# Patient Record
Sex: Female | Born: 1940 | Race: White | Hispanic: No | Marital: Married | Smoking: Never smoker
Health system: Southern US, Community
[De-identification: ages and names within clinical notes are randomized; demographics above are authoritative.]

## PROBLEM LIST (undated history)

## (undated) DIAGNOSIS — E785 Hyperlipidemia, unspecified: Secondary | ICD-10-CM

## (undated) DIAGNOSIS — D0510 Intraductal carcinoma in situ of unspecified breast: Secondary | ICD-10-CM

## (undated) DIAGNOSIS — Z780 Asymptomatic menopausal state: Secondary | ICD-10-CM

## (undated) DIAGNOSIS — K2970 Gastritis, unspecified, without bleeding: Secondary | ICD-10-CM

## (undated) DIAGNOSIS — C50919 Malignant neoplasm of unspecified site of unspecified female breast: Secondary | ICD-10-CM

## (undated) DIAGNOSIS — N814 Uterovaginal prolapse, unspecified: Secondary | ICD-10-CM

## (undated) DIAGNOSIS — K635 Polyp of colon: Secondary | ICD-10-CM

## (undated) DIAGNOSIS — K648 Other hemorrhoids: Secondary | ICD-10-CM

## (undated) DIAGNOSIS — C4491 Basal cell carcinoma of skin, unspecified: Secondary | ICD-10-CM

## (undated) DIAGNOSIS — N952 Postmenopausal atrophic vaginitis: Secondary | ICD-10-CM

## (undated) DIAGNOSIS — K269 Duodenal ulcer, unspecified as acute or chronic, without hemorrhage or perforation: Secondary | ICD-10-CM

## (undated) HISTORY — DX: Other hemorrhoids: K64.8

## (undated) HISTORY — DX: Postmenopausal atrophic vaginitis: N95.2

## (undated) HISTORY — DX: Asymptomatic menopausal state: Z78.0

## (undated) HISTORY — DX: Gastritis, unspecified, without bleeding: K29.70

## (undated) HISTORY — PX: OTHER SURGICAL HISTORY: SHX169

## (undated) HISTORY — DX: Intraductal carcinoma in situ of unspecified breast: D05.10

## (undated) HISTORY — DX: Hyperlipidemia, unspecified: E78.5

## (undated) HISTORY — DX: Duodenal ulcer, unspecified as acute or chronic, without hemorrhage or perforation: K26.9

## (undated) HISTORY — DX: Uterovaginal prolapse, unspecified: N81.4

## (undated) HISTORY — DX: Basal cell carcinoma of skin, unspecified: C44.91

## (undated) HISTORY — PX: DILATION AND CURETTAGE OF UTERUS: SHX78

## (undated) HISTORY — DX: Polyp of colon: K63.5

---

## 2004-07-28 ENCOUNTER — Ambulatory Visit: Payer: Self-pay | Admitting: Unknown Physician Specialty

## 2004-09-02 ENCOUNTER — Ambulatory Visit: Payer: Self-pay | Admitting: Obstetrics and Gynecology

## 2005-09-08 ENCOUNTER — Ambulatory Visit: Payer: Self-pay | Admitting: Obstetrics and Gynecology

## 2006-09-12 ENCOUNTER — Ambulatory Visit: Payer: Self-pay | Admitting: Obstetrics and Gynecology

## 2007-09-21 ENCOUNTER — Ambulatory Visit: Payer: Self-pay | Admitting: Obstetrics and Gynecology

## 2007-10-01 ENCOUNTER — Ambulatory Visit: Payer: Self-pay | Admitting: Obstetrics and Gynecology

## 2007-10-04 DIAGNOSIS — C50919 Malignant neoplasm of unspecified site of unspecified female breast: Secondary | ICD-10-CM

## 2007-10-04 HISTORY — PX: MASTECTOMY: SHX3

## 2007-10-04 HISTORY — DX: Malignant neoplasm of unspecified site of unspecified female breast: C50.919

## 2007-10-31 ENCOUNTER — Ambulatory Visit: Payer: Self-pay | Admitting: Surgery

## 2007-11-04 ENCOUNTER — Ambulatory Visit: Payer: Self-pay | Admitting: Internal Medicine

## 2007-11-07 ENCOUNTER — Ambulatory Visit: Payer: Self-pay | Admitting: Surgery

## 2007-11-26 ENCOUNTER — Ambulatory Visit: Payer: Self-pay | Admitting: Internal Medicine

## 2007-12-02 ENCOUNTER — Ambulatory Visit: Payer: Self-pay | Admitting: Internal Medicine

## 2007-12-24 ENCOUNTER — Ambulatory Visit: Payer: Self-pay | Admitting: Surgery

## 2008-01-02 ENCOUNTER — Ambulatory Visit: Payer: Self-pay | Admitting: Internal Medicine

## 2008-01-25 ENCOUNTER — Ambulatory Visit: Payer: Self-pay | Admitting: Internal Medicine

## 2008-04-02 ENCOUNTER — Ambulatory Visit: Payer: Self-pay | Admitting: Internal Medicine

## 2008-04-25 ENCOUNTER — Ambulatory Visit: Payer: Self-pay | Admitting: Internal Medicine

## 2008-05-03 ENCOUNTER — Ambulatory Visit: Payer: Self-pay | Admitting: Internal Medicine

## 2008-10-03 ENCOUNTER — Ambulatory Visit: Payer: Self-pay | Admitting: Internal Medicine

## 2008-10-10 ENCOUNTER — Ambulatory Visit: Payer: Self-pay | Admitting: Obstetrics and Gynecology

## 2008-10-24 ENCOUNTER — Ambulatory Visit: Payer: Self-pay | Admitting: Internal Medicine

## 2008-11-03 ENCOUNTER — Ambulatory Visit: Payer: Self-pay | Admitting: Internal Medicine

## 2009-04-02 ENCOUNTER — Ambulatory Visit: Payer: Self-pay | Admitting: Internal Medicine

## 2009-04-24 ENCOUNTER — Ambulatory Visit: Payer: Self-pay | Admitting: Internal Medicine

## 2009-05-03 ENCOUNTER — Ambulatory Visit: Payer: Self-pay | Admitting: Internal Medicine

## 2009-08-19 ENCOUNTER — Ambulatory Visit: Payer: Self-pay | Admitting: Unknown Physician Specialty

## 2009-10-12 ENCOUNTER — Ambulatory Visit: Payer: Self-pay | Admitting: Obstetrics and Gynecology

## 2009-12-01 ENCOUNTER — Ambulatory Visit: Payer: Self-pay | Admitting: Internal Medicine

## 2009-12-25 ENCOUNTER — Ambulatory Visit: Payer: Self-pay | Admitting: Internal Medicine

## 2010-01-01 ENCOUNTER — Ambulatory Visit: Payer: Self-pay | Admitting: Internal Medicine

## 2010-10-14 ENCOUNTER — Ambulatory Visit: Payer: Self-pay | Admitting: Obstetrics and Gynecology

## 2010-11-30 ENCOUNTER — Ambulatory Visit: Payer: Self-pay | Admitting: Specialist

## 2010-12-22 ENCOUNTER — Ambulatory Visit: Payer: Self-pay | Admitting: Unknown Physician Specialty

## 2010-12-22 DIAGNOSIS — K635 Polyp of colon: Secondary | ICD-10-CM | POA: Insufficient documentation

## 2010-12-22 DIAGNOSIS — K2970 Gastritis, unspecified, without bleeding: Secondary | ICD-10-CM | POA: Insufficient documentation

## 2010-12-24 ENCOUNTER — Ambulatory Visit: Payer: Self-pay | Admitting: Internal Medicine

## 2011-10-24 ENCOUNTER — Ambulatory Visit: Payer: Self-pay | Admitting: Obstetrics and Gynecology

## 2011-12-23 ENCOUNTER — Ambulatory Visit: Payer: Self-pay | Admitting: Internal Medicine

## 2012-01-02 ENCOUNTER — Ambulatory Visit: Payer: Self-pay | Admitting: Internal Medicine

## 2012-10-25 ENCOUNTER — Ambulatory Visit: Payer: Self-pay | Admitting: Obstetrics and Gynecology

## 2012-12-27 ENCOUNTER — Ambulatory Visit: Payer: Self-pay | Admitting: Internal Medicine

## 2013-10-28 ENCOUNTER — Ambulatory Visit: Payer: Self-pay | Admitting: Obstetrics and Gynecology

## 2014-09-16 LAB — HM PAP SMEAR: HM Pap smear: NEGATIVE

## 2014-11-12 ENCOUNTER — Ambulatory Visit: Payer: Self-pay | Admitting: Obstetrics and Gynecology

## 2014-11-12 DIAGNOSIS — Z1231 Encounter for screening mammogram for malignant neoplasm of breast: Secondary | ICD-10-CM | POA: Diagnosis not present

## 2014-11-12 DIAGNOSIS — Z853 Personal history of malignant neoplasm of breast: Secondary | ICD-10-CM | POA: Diagnosis not present

## 2014-11-12 DIAGNOSIS — Z9012 Acquired absence of left breast and nipple: Secondary | ICD-10-CM | POA: Diagnosis not present

## 2014-11-12 LAB — HM MAMMOGRAPHY: HM Mammogram: NEGATIVE

## 2015-07-19 DIAGNOSIS — Z23 Encounter for immunization: Secondary | ICD-10-CM | POA: Diagnosis not present

## 2015-09-22 ENCOUNTER — Ambulatory Visit (INDEPENDENT_AMBULATORY_CARE_PROVIDER_SITE_OTHER): Payer: Medicare Other | Admitting: Obstetrics and Gynecology

## 2015-09-22 ENCOUNTER — Other Ambulatory Visit: Payer: Self-pay | Admitting: Obstetrics and Gynecology

## 2015-09-22 ENCOUNTER — Encounter: Payer: Self-pay | Admitting: Obstetrics and Gynecology

## 2015-09-22 VITALS — BP 173/97 | HR 94 | Ht 62.0 in | Wt 134.7 lb

## 2015-09-22 DIAGNOSIS — Z01419 Encounter for gynecological examination (general) (routine) without abnormal findings: Secondary | ICD-10-CM

## 2015-09-22 DIAGNOSIS — R030 Elevated blood-pressure reading, without diagnosis of hypertension: Secondary | ICD-10-CM

## 2015-09-22 DIAGNOSIS — D0512 Intraductal carcinoma in situ of left breast: Secondary | ICD-10-CM

## 2015-09-22 DIAGNOSIS — Z780 Asymptomatic menopausal state: Secondary | ICD-10-CM

## 2015-09-22 DIAGNOSIS — K2981 Duodenitis with bleeding: Secondary | ICD-10-CM | POA: Diagnosis not present

## 2015-09-22 DIAGNOSIS — R42 Dizziness and giddiness: Secondary | ICD-10-CM | POA: Diagnosis not present

## 2015-09-22 DIAGNOSIS — N814 Uterovaginal prolapse, unspecified: Secondary | ICD-10-CM | POA: Diagnosis not present

## 2015-09-22 DIAGNOSIS — Z1239 Encounter for other screening for malignant neoplasm of breast: Secondary | ICD-10-CM

## 2015-09-22 DIAGNOSIS — R300 Dysuria: Secondary | ICD-10-CM | POA: Diagnosis not present

## 2015-09-22 DIAGNOSIS — IMO0001 Reserved for inherently not codable concepts without codable children: Secondary | ICD-10-CM

## 2015-09-22 DIAGNOSIS — E785 Hyperlipidemia, unspecified: Secondary | ICD-10-CM | POA: Diagnosis not present

## 2015-09-22 DIAGNOSIS — Z1211 Encounter for screening for malignant neoplasm of colon: Secondary | ICD-10-CM

## 2015-09-22 DIAGNOSIS — K269 Duodenal ulcer, unspecified as acute or chronic, without hemorrhage or perforation: Secondary | ICD-10-CM

## 2015-09-22 DIAGNOSIS — K921 Melena: Secondary | ICD-10-CM

## 2015-09-22 DIAGNOSIS — Z1231 Encounter for screening mammogram for malignant neoplasm of breast: Secondary | ICD-10-CM

## 2015-09-22 LAB — POCT URINALYSIS DIPSTICK
Bilirubin, UA: NEGATIVE
Glucose, UA: NEGATIVE
KETONES UA: NEGATIVE
LEUKOCYTES UA: NEGATIVE
Nitrite, UA: NEGATIVE
Protein, UA: NEGATIVE
Spec Grav, UA: 1.015
UROBILINOGEN UA: 0.2
pH, UA: 6

## 2015-09-22 MED ORDER — OMEPRAZOLE 40 MG PO CPDR: 40.0000 mg | DELAYED_RELEASE_CAPSULE | Freq: Every day | ORAL | Status: DC

## 2015-09-22 NOTE — Patient Instructions (Signed)
1.  No pap smear is done today. 2.  Your mammogram is due in February 2017. 3.  Stool guaiac cards are given to check for blood in the stool. 4.  Blood work is done today including CBC and CMP to assess hypertension and rule out anemia. 5.  Cardiology referral to Dr. Karl Bales for hypertension and dizziness. 6.  If there is anemia on your blood work or if there is blood in her stool, he will have to see Dr. Tiffany Kocher in gastroenterology to rule out ulcer disease. 7.  Take your blood pressure at home once a day for the next 2 weeks before seeing cardiology. 8.  Return in 1 year or as needed

## 2015-09-22 NOTE — Progress Notes (Signed)
Patient ID: Terri Clark, female   DOB: Jul 26, 1941, 74 y.o.   MRN: [id number] ANNUAL PREVENTATIVE CARE GYN  ENCOUNTER NOTE  Subjective:       Terri Clark is a 74 y.o. G57P2002 female here for a routine annual gynecologic exam.  Current complaints: 1.  Burning with urination  2. Dizziness and feeling tired 3. Black tarry stools  Terri Clark presents today for her annual physical. Significant concerns today include dysuria. She also is experiencing intermittent dizziness, lightheadedness, just not feeling well. Energy level is not what it used to be. She does walk on the treadmill 40 minutes a day 5 days a week. She does not experience dyspareunia, orthopnea, lower extremity swelling, chest discomfort, palpitations. She has not had a cardiology workup. Patient also has history of peptic ulcer disease. Over the past several months she has been developing black tarry stools without bright red blood in the bowel movements. She did modify her diet to decrease spicy food intake, and has continued with omeprazole and antacids. Her acid indigestion has diminished as has her stools. She did have a colonoscopy last year which was normal. She did not have an EGD.   Gynecologic History No LMP recorded. Patient is postmenopausal. Contraception: post menopausal status Last Pap: 09/16/2014 neg results were: normal/ Last mammogram: 11/12/2014 birad 1. Results were: {norm/abn:[id number]  Obstetric History OB History  Gravida Para Term Preterm AB SAB TAB Ectopic Multiple Living  2 2 2       2     # Outcome Date GA Lbr Len/2nd Weight Sex Delivery Anes PTL Lv  2 Term      Vag-Spont   Y  1 Term      Vag-Spont   Y      Past Medical History  Diagnosis Date  . Gastritis   . Duodenal ulcer   . Colon polyps   . Hyperlipemia   . Basal cell carcinoma   . Internal hemorrhoid   . Uterine prolapse   . Menopause   . Vaginal atrophy   . 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 Mastectomy      BILATERAL SIMPLE WITH SENTINTEL NODE BIOPSY  . Dilation and curettage of uterus      Current Outpatient Prescriptions on File Prior to Visit  Medication Sig Dispense Refill  . aspirin 81 MG tablet Take 81 mg by mouth daily.    Marland Kitchen b complex vitamins tablet Take 1 tablet by mouth daily.    . calcium-vitamin D (OSCAL WITH D) 500-200 MG-UNIT tablet Take 1 tablet by mouth.    . Chromium Picolinate 500 MCG CAPS Take by mouth.    . loratadine (CLARITIN) 10 MG tablet Take 10 mg by mouth daily.    . Melatonin 10 MG CAPS Take by mouth.    . Multiple Vitamins-Minerals (MULTIVITAMIN ADULT PO) Take by mouth.    . omega-3 acid ethyl esters (LOVAZA) 1 G capsule Take by mouth 2 (two) times daily.    Marland Kitchen omeprazole (PRILOSEC) 40 MG capsule Take 40 mg by mouth daily.     No current facility-administered medications on file prior to visit.    Allergies  Allergen Reactions  . Amoxicillin   . Biaxin [Clarithromycin]   . Theracodophen-325 [Hydrocodone-Apap-Dietary Prod]     Social History   Social History  . Marital Status: Married    Spouse  Name: N/A  . Number of Children: N/A  . Years of Education: N/A   Occupational History  . Not on file.   Social History Main Topics  . Smoking status: Never Smoker   . Smokeless tobacco: Not on file  . Alcohol Use: No  . Drug Use: No  . Sexual Activity: Not Currently    Birth Control/ Protection: Post-menopausal   Other Topics Concern  . Not on file   Social History Narrative    Family History  Problem Relation Age of Onset  . Colon cancer Sister   . Breast cancer Neg Hx   . Ovarian cancer Neg Hx   . Diabetes Neg Hx   . Heart disease Neg Hx     The following portions of the patient's history were reviewed and updated as appropriate: allergies, current medications, past family history, past medical history, past social history, past surgical  history and problem list.  Review of Systems ROS Review of Systems - General ROS: negative for - chills, fatigue, fever, hot flashes, night sweats, weight gain or weight loss Psychological ROS: negative for - anxiety, decreased libido, depression, mood swings, physical abuse or sexual abuse Ophthalmic ROS: negative for - blurry vision, eye pain or loss of vision ENT ROS: negative for - headaches, hearing change, visual changes or vocal changes Allergy and Immunology ROS: negative for - hives, itchy/watery eyes or seasonal allergies Hematological and Lymphatic ROS: negative for - bleeding problems, bruising, swollen lymph nodes or weight loss Endocrine ROS: negative for - galactorrhea, hair pattern changes, hot flashes, malaise/lethargy, mood swings, palpitations, polydipsia/polyuria, skin changes, temperature intolerance or unexpected weight changes Breast ROS: negative for - new or changing breast lumps or nipple discharge Respiratory ROS: negative for - cough or shortness of breath Cardiovascular ROS: negative for - chest pain, irregular heartbeat, palpitations or shortness of breath Gastrointestinal ROS: no abdominal pain, change in bowel habits, or black or bloody stools Genito-Urinary ROS: no dysuria, trouble voiding, or hematuria Musculoskeletal ROS: negative for - joint pain or joint stiffness Neurological ROS: negative for - bowel and bladder control changes Dermatological ROS: negative for rash and skin lesion changes   Objective:   BP 173/97 mmHg  Pulse 94  Ht 5\' 2"  (1.575 m)  Wt 134 lb 11.2 oz (61.1 kg)  BMI 24.63 kg/m2 CONSTITUTIONAL: Well-developed, well-nourished female in no acute distress.  PSYCHIATRIC: Normal mood and affect. Normal behavior. Normal judgment and thought content. [place]: Alert and oriented to person, place, and time. Normal muscle tone coordination. No cranial nerve deficit noted. HENT:  Normocephalic, atraumatic, External right and left ear normal.  Oropharynx is clear and moist EYES: Conjunctivae and EOM are normal. Pupils are equal, round, and reactive to light. No scleral icterus.  NECK: Normal range of motion, supple, no masses.  Normal thyroid.  SKIN: Skin is warm and dry. No rash noted. Not diaphoretic. No erythema. No pallor. CARDIOVASCULAR: Normal heart rate noted, regular rhythm, no murmur. no carotid bruits. RESPIRATORY: Clear to auscultation bilaterally. Effort and breath sounds normal, no problems with respiration noted. BREASTS: No masses, skin changes, nipple drainage, or lymphadenopathy. Post mastectomy changes, nontender, left breast ABDOMEN: Soft, normal bowel sounds, no distention noted.  No tenderness, rebound or guarding.  BLADDER: Normal PELVIC:  External Genitalia: Normal  BUS: Normal  Vagina:  Atrophic changes present  Cervix: Normal; second-degree prolapse with cervix coming within 3 cm of the introitus   Uterus: Normal size and shape, prolapsed to mid vagina (second-degree)   Adnexa:  Normal  RV: External Exam NormaI, No Rectal Masses and Normal Sphincter tone  MUSCULOSKELETAL: Normal range of motion. No tenderness.  No cyanosis, clubbing, or edema.  2+ distal pulses. LYMPHATIC: No Axillary, Supraclavicular, or Inguinal Adenopathy.    Assessment:   Annual gynecologic examination 74 y.o. Contraception: post menopausal status bmi- 24 Elevated blood pressure Dizziness, fatigue, possibly cardiac related History of gastritis and duodenal ulcer; history of melena in the past few months with resolution following changes and continuation with reflux medications. Normal colonoscopy in the past year without recent EGD   Plan:  Pap: Not needed Mammogram: Ordered Stool Guaiac Testing:  Ordered Labs: tsh, vit d, lipid, a1c, fbs Routine preventative health maintenance measures emphasized: Exercise/Diet/Weight control, Tobacco Warnings, Alcohol/Substance use risks and Stress Management  cardiology consult for  hypertension and dizziness/fatigue symptomatology Check CBC to rule out anemia. If anemia is present and/or if so guaiac card testing is positive, recommend follow-up with Dr. Vira Agar in GI for assessment of ulcer disease. Return to [place], [place]  Brayton Mars, MD  Note: This dictation was prepared with Dragon dictation along with smaller phrase technology. Any transcriptional errors that result from this process are unintentional.

## 2015-09-23 LAB — URINE CULTURE: Organism ID, Bacteria: NO GROWTH

## 2015-09-24 DIAGNOSIS — R030 Elevated blood-pressure reading, without diagnosis of hypertension: Secondary | ICD-10-CM

## 2015-09-24 DIAGNOSIS — N814 Uterovaginal prolapse, unspecified: Secondary | ICD-10-CM | POA: Insufficient documentation

## 2015-09-24 DIAGNOSIS — K269 Duodenal ulcer, unspecified as acute or chronic, without hemorrhage or perforation: Secondary | ICD-10-CM | POA: Insufficient documentation

## 2015-09-24 DIAGNOSIS — K2981 Duodenitis with bleeding: Secondary | ICD-10-CM | POA: Insufficient documentation

## 2015-09-24 DIAGNOSIS — D0512 Intraductal carcinoma in situ of left breast: Secondary | ICD-10-CM | POA: Insufficient documentation

## 2015-09-24 DIAGNOSIS — Z780 Asymptomatic menopausal state: Secondary | ICD-10-CM | POA: Insufficient documentation

## 2015-09-24 DIAGNOSIS — IMO0001 Reserved for inherently not codable concepts without codable children: Secondary | ICD-10-CM | POA: Insufficient documentation

## 2015-09-24 DIAGNOSIS — Z800 Family history of malignant neoplasm of digestive organs: Secondary | ICD-10-CM | POA: Insufficient documentation

## 2015-09-24 DIAGNOSIS — E7800 Pure hypercholesterolemia, unspecified: Secondary | ICD-10-CM | POA: Insufficient documentation

## 2015-09-24 DIAGNOSIS — C4441 Basal cell carcinoma of skin of scalp and neck: Secondary | ICD-10-CM | POA: Insufficient documentation

## 2015-09-24 LAB — FECAL OCCULT BLOOD, IMMUNOCHEMICAL: Fecal Occult Bld: NEGATIVE

## 2015-09-29 ENCOUNTER — Other Ambulatory Visit: Payer: Medicare Other

## 2015-09-29 DIAGNOSIS — Z131 Encounter for screening for diabetes mellitus: Secondary | ICD-10-CM | POA: Diagnosis not present

## 2015-09-29 DIAGNOSIS — E559 Vitamin D deficiency, unspecified: Secondary | ICD-10-CM | POA: Diagnosis not present

## 2015-09-29 DIAGNOSIS — Z01419 Encounter for gynecological examination (general) (routine) without abnormal findings: Secondary | ICD-10-CM | POA: Diagnosis not present

## 2015-09-29 DIAGNOSIS — R42 Dizziness and giddiness: Secondary | ICD-10-CM | POA: Diagnosis not present

## 2015-09-29 DIAGNOSIS — R030 Elevated blood-pressure reading, without diagnosis of hypertension: Secondary | ICD-10-CM | POA: Diagnosis not present

## 2015-09-29 DIAGNOSIS — K921 Melena: Secondary | ICD-10-CM | POA: Diagnosis not present

## 2015-09-29 DIAGNOSIS — E785 Hyperlipidemia, unspecified: Secondary | ICD-10-CM | POA: Diagnosis not present

## 2015-09-30 LAB — VITAMIN D 25 HYDROXY (VIT D DEFICIENCY, FRACTURES): VIT D 25 HYDROXY: 58.1 ng/mL (ref 30.0–100.0)

## 2015-09-30 LAB — COMPREHENSIVE METABOLIC PANEL
A/G RATIO: 1.6 (ref 1.1–2.5)
ALK PHOS: 72 IU/L (ref 39–117)
ALT: 16 IU/L (ref 0–32)
AST: 21 IU/L (ref 0–40)
Albumin: 4.2 g/dL (ref 3.5–4.8)
BILIRUBIN TOTAL: 0.3 mg/dL (ref 0.0–1.2)
BUN/Creatinine Ratio: 21 (ref 11–26)
BUN: 15 mg/dL (ref 8–27)
CHLORIDE: 100 mmol/L (ref 96–106)
CO2: 26 mmol/L (ref 18–29)
Creatinine, Ser: 0.71 mg/dL (ref 0.57–1.00)
GFR calc Af Amer: 96 mL/min/{1.73_m2} (ref >59)
GFR, EST NON AFRICAN AMERICAN: 84 mL/min/{1.73_m2} (ref >59)
GLOBULIN, TOTAL: 2.6 g/dL (ref 1.5–4.5)
GLUCOSE: 87 mg/dL (ref 65–99)
POTASSIUM: 4.6 mmol/L (ref 3.5–5.2)
SODIUM: 141 mmol/L (ref 134–144)

## 2015-09-30 LAB — HEMOGLOBIN A1C
ESTIMATED AVERAGE GLUCOSE: 120 mg/dL
HEMOGLOBIN A1C: 5.8 % — AB (ref 4.8–5.6)

## 2015-09-30 LAB — CBC WITH DIFFERENTIAL/PLATELET
BASOS ABS: 0.1 10*3/uL (ref 0.0–0.2)
BASOS: 1 %
EOS (ABSOLUTE): 0.3 10*3/uL (ref 0.0–0.4)
Eos: 4 %
Hematocrit: 39.9 % (ref 34.0–46.6)
Hemoglobin: 13.4 g/dL (ref 11.1–15.9)
IMMATURE GRANULOCYTES: 0 %
Immature Grans (Abs): 0 10*3/uL (ref 0.0–0.1)
Lymphocytes Absolute: 2.2 10*3/uL (ref 0.7–3.1)
Lymphs: 32 %
MCH: 30.6 pg (ref 26.6–33.0)
MCHC: 33.6 g/dL (ref 31.5–35.7)
MCV: 91 fL (ref 79–97)
MONOS ABS: 0.7 10*3/uL (ref 0.1–0.9)
Monocytes: 10 %
NEUTROS ABS: 3.8 10*3/uL (ref 1.4–7.0)
NEUTROS PCT: 53 %
PLATELETS: 431 10*3/uL — AB (ref 150–379)
RBC: 4.38 x10E6/uL (ref 3.77–5.28)
RDW: 13.6 % (ref 12.3–15.4)
WBC: 7 10*3/uL (ref 3.4–10.8)

## 2015-09-30 LAB — LIPID PANEL
CHOLESTEROL TOTAL: 244 mg/dL — AB (ref 100–199)
Chol/HDL Ratio: 2.8 ratio units (ref 0.0–4.4)
HDL: 87 mg/dL (ref >39)
LDL Calculated: 139 mg/dL — ABNORMAL HIGH (ref 0–99)
Triglycerides: 88 mg/dL (ref 0–149)
VLDL Cholesterol Cal: 18 mg/dL (ref 5–40)

## 2015-09-30 LAB — TSH: TSH: 2.26 u[IU]/mL (ref 0.450–4.500)

## 2015-11-12 ENCOUNTER — Encounter (INDEPENDENT_AMBULATORY_CARE_PROVIDER_SITE_OTHER): Payer: Self-pay

## 2015-11-12 ENCOUNTER — Ambulatory Visit (INDEPENDENT_AMBULATORY_CARE_PROVIDER_SITE_OTHER): Payer: Medicare Other | Admitting: Cardiovascular Disease

## 2015-11-12 ENCOUNTER — Encounter: Payer: Self-pay | Admitting: Cardiovascular Disease

## 2015-11-12 VITALS — BP 140/70 | HR 89 | Ht 62.0 in | Wt 131.0 lb

## 2015-11-12 DIAGNOSIS — R079 Chest pain, unspecified: Secondary | ICD-10-CM

## 2015-11-12 DIAGNOSIS — R42 Dizziness and giddiness: Secondary | ICD-10-CM | POA: Diagnosis not present

## 2015-11-12 DIAGNOSIS — I159 Secondary hypertension, unspecified: Secondary | ICD-10-CM | POA: Diagnosis not present

## 2015-11-12 DIAGNOSIS — R030 Elevated blood-pressure reading, without diagnosis of hypertension: Secondary | ICD-10-CM

## 2015-11-12 DIAGNOSIS — R0602 Shortness of breath: Secondary | ICD-10-CM

## 2015-11-12 DIAGNOSIS — IMO0001 Reserved for inherently not codable concepts without codable children: Secondary | ICD-10-CM

## 2015-11-12 DIAGNOSIS — E785 Hyperlipidemia, unspecified: Secondary | ICD-10-CM

## 2015-11-12 NOTE — Assessment & Plan Note (Signed)
She has provided blood pressure readings today, some of which are very low I suspect when she stands up at nighttime to get out of bed she is having orthostasis. Suggested she sit for several minutes before standing to go the bathroom Also suggested she increase her fluid intake during the daytime, but cutback on her fluids into the evening If symptoms get worse, she could wear thigh-high compression hose Suggested she liberalize her salt intake, she had been following a very strict salt regiment

## 2015-11-12 NOTE — Assessment & Plan Note (Signed)
Suspect she had elevated blood pressure on her office visit with Dr. Enzo Bi though pages of blood pressures since that time have shown excellent if not low blood pressures, possibly contributing to periodic episodes of orthostasis. No medications needed at this time, suggested she liberalize her salt intake as she has been very strict since that appointment

## 2015-11-12 NOTE — Assessment & Plan Note (Signed)
She prefers not to take a cholesterol medication As triglycerides are low, suggested she could decrease her fish oil intake, Perhaps increase oatmeal and try over-the-counter red yeast rice In addition to her dietary changes, this should help her numbers  We did discuss CT coronary calcium scoring for risk stratification. She will research this and call us if she would like this test

## 2015-11-12 NOTE — Progress Notes (Signed)
Patient ID: Terri Clark, female    DOB: 04/02/1941, 75 y.o.   MRN: [id number]  HPI Comments: [name] is a pleasant 75 year old woman with remote history of smoking for 7 years when she was a teenager, who presents for consultation by referral from Dr. Enzo Bi for hypertension and dizziness/lightheadedness.  On her annual visit with Dr. Enzo Bi she was noted to have systolic pressure in the [id number] range She reports that she was anxious and has been monitoring her blood pressure daily since that time She provides these measurements with her today, on average systolic pressure ranging from 104 up to the 130 range, rarely more than 140. Heart rate typically 60s to 70s, one episode of heart rate 115 bpm in the setting of an allergy attack, coughing  She does report having episodes of lightheadedness, dizziness at nighttime when she gets out of bed to go to the bathroom This does not happen all time, rarely Typically will happen if she stands up too quickly Sometimes dizzy even coming back from the bathroom  She does try to drink fluids in the daytime, Reports that she has changed her diet significantly since she was told her cholesterol was elevated  Recent lab work showing total cholesterol around 240 She has been taking high-dose fish oil because she thought it was good for her She prefers not to be in a cholesterol medication  She does exercise on a regular basis, 40 minutes aerobic 5 days per week  Lab work reviewed with her showing hemoglobin A1c 5.8  EKG on today's visit shows normal sinus rhythm with rate 89 bpm, no significant ST or T-wave changes   Lab Results      Component                Value               Date                      CHOL                     244*                09/29/2015                HDL                      87                  09/29/2015                LDLCALC                  139*                09/29/2015                TRIG                      88                  09/29/2015             Review of Systems  Constitutional: Negative.   Respiratory: Negative.   Cardiovascular: Negative.   Gastrointestinal: Negative.   Musculoskeletal: Negative.   Neurological: Positive for light-headedness.  Hematological: Negative.   Psychiatric/Behavioral: Negative.   All other systems reviewed and are  negative.   BP 140/70 mmHg  Pulse 89  Ht 5\' 2"  (1.575 m)  Wt 131 lb (59.421 kg)  BMI 23.95 kg/m2  Physical Exam  Constitutional: She is oriented to person, place, and time. She appears well-developed and well-nourished.  HENT:  Head: Normocephalic.  Nose: Nose normal.  Mouth/Throat: Oropharynx is clear and moist.  Eyes: Conjunctivae are normal. Pupils are equal, round, and reactive to light.  Neck: Normal range of motion. Neck supple. No JVD present.  Cardiovascular: Normal rate, regular rhythm, normal heart sounds and intact distal pulses.  Exam reveals no gallop and no friction rub.   No murmur heard. Pulmonary/Chest: Effort normal and breath sounds normal. No respiratory distress. She has no wheezes. She has no rales. She exhibits no tenderness.  Abdominal: Soft. Bowel sounds are normal. She exhibits no distension. There is no tenderness.  Musculoskeletal: Normal range of motion. She exhibits no edema or tenderness.  Lymphadenopathy:    She has no cervical adenopathy.  Neurological: She is alert and oriented to person, place, and time. Coordination normal.  Skin: Skin is warm and dry. No rash noted. No erythema.  Psychiatric: She has a normal mood and affect. Her behavior is normal. Judgment and thought content normal.

## 2015-11-12 NOTE — Patient Instructions (Addendum)
You are doing well. No medication changes were made.  Try the RED YEAST RICE for cholesterol  Lightheaded spells is likely from low blood pressure Drink more fluids  Research CT coronary calcium score for risk assessment  Please call us if you have new issues that need to be addressed before your next appt.    Red Yeast Rice capsules What is this medicine? RED YEAST RICE (red yeest rahys) is intended to be used by healthy adults to help lower blood cholesterol in conjunction with a healthy diet and a regular exercise program. The FDA has not approved this supplement for any medical use. If medical treatment is needed for cholesterol control or any other disease, you should contact your doctor or health care professional regarding the use of this product. This supplement may be used for other purposes; ask your health care provider or pharmacist if you have questions. This medicine may be used for other purposes; ask your health care provider or pharmacist if you have questions. What should I tell my health care provider before I take this medicine? They need to know if you have any of these conditions: -frequently drink alcoholic beverages -kidney disease -liver disease -muscle aches or weakness -other medical condition -an unusual or allergic reaction to red yeast rice, went yeast, lovastatin, other 'statin' medications, other medicines, foods, dyes, or preservatives -pregnant or trying to get pregnant -breast-feeding How should I use this medicine? Take this supplement by mouth with a glass of water. Follow the directions on the package labeling, or take as directed by your health care professional. Do not take this supplement more often than directed. Contact your pediatrician or health care professional regarding the use of this supplement in children. Special care may be needed. This supplement is not recommended for use in children. Overdosage: If you think you have taken too  much of this medicine contact a poison control center or emergency room at once. NOTE: This medicine is only for you. Do not share this medicine with others. What if I miss a dose? If you miss a dose, take it as soon as you can. If it is almost time for your next dose, take only that dose. Do not take double or extra doses. What may interact with this medicine? Do not take this medicine with any of the following medications: -clarithromycin -delavirdine -erythromycin -grapefruit juice -protease inhibitors used to treat HIV infection -medicines for fungal infections like itraconazole, ketoconazole, posaconazole, and voriconazole -mibefradil -nefazodone -other medicines for high cholesterol -telithromycin -troleandomycin This medicine may also interact with the following medications: -alcohol -amiodarone -colchicine -cyclosporine -danazol -diltiazem -fenofibrate -fluconazole -gemfibrozil -mifepristone, RU-486 -niacin -St. John's wort -verapamil -voriconazole -warfarin This list may not describe all possible interactions. Give your health care provider a list of all the medicines, herbs, non-prescription drugs, or dietary supplements you use. Also tell them if you smoke, drink alcohol, or use illegal drugs. Some items may interact with your medicine. What should I watch for while using this medicine? Visit your doctor or health care professional for regular check-ups. You may need regular tests to make sure your liver is working properly. Tell you doctor or health care professional right away if you get any unexplained muscle pain, tenderness, or weakness, especially if you also have a fever and tiredness. Some drugs may increase the risk of side effects from this supplement. If you are given certain antibiotics or antifungals, you should stop taking this supplement during those treatments. Check with your doctor or  pharmacist for advice. If you are scheduled for any medical or  dental procedure, tell your healthcare provider that you are taking this supplement. You may need to stop taking this supplement before the procedure. Do not use this drug if you are pregnant or breast-feeding. Serious side effects to an unborn child or to an infant are possible. Talk to your doctor or pharmacist for more information. Herbal or dietary supplements are not regulated like medicines. Rigid quality control standards are not required for dietary supplements. The purity and strength of these products can vary. The safety and effect of this dietary supplement for a certain disease or illness is not well known. This product is not intended to diagnose, treat, cure or prevent any disease. The Food and Drug Administration suggests the following to help consumers protect themselves: -Always read product labels and follow directions. -Natural does not mean a product is safe for humans to take. -Look for products that include USP after the ingredient name. This means that the manufacturer followed the standards of the [place] Pharmacopoeia. -Supplements made or sold by a nationally known food or drug company are more likely to be made under tight controls. You can write to the company for more information about how the product was made. What side effects may I notice from receiving this medicine? Side effects that you should report to your doctor or health care professional as soon as possible: -allergic reactions like skin rash, itching or hives, swelling of the face, lips, or tongue -dark urine -fever -joint pain -muscle cramps, pain -redness, blistering, peeling or loosening of the skin, including inside the mouth -trouble passing urine or change in the amount of urine -unusually weak or tired -yellowing of the eyes or skin Side effects that usually do not require medical attention (report to your doctor or health care professional if they continue or are  bothersome): -constipation -headache -stomach gas, pain, upset -nausea -trouble sleeping This list may not describe all possible side effects. Call your doctor for medical advice about side effects. You may report side effects to FDA at 1-800-FDA-1088. Where should I keep my medicine? Keep out of the reach of children. Store at room temperature between 15 and 30 degrees C (59 and 86 degrees F). Throw away any unused medicine after the expiration date. NOTE: This sheet is a summary. It may not cover all possible information. If you have questions about this medicine, talk to your doctor, pharmacist, or health care provider.    2016, Elsevier/Gold Standard. (2014-05-27 10:26:14) Hypotension As your heart beats, it forces blood through your arteries. This force is your blood pressure. If your blood pressure is too low for you to go about your normal activities or to support the organs of your body, you have hypotension. Hypotension is also referred to as low blood pressure. When your blood pressure becomes too low, you may not get enough blood to your brain. As a result, you may feel weak, feel lightheaded, or develop a rapid heart rate. In a more severe [place], you may faint. CAUSES Various conditions can cause hypotension. These include:  Blood loss.  Dehydration.  Heart or endocrine problems.  Pregnancy.  Severe infection.  Not having a well-balanced diet filled with needed nutrients.  Severe allergic reactions (anaphylaxis). Some medicines, such as blood pressure medicine or water pills (diuretics), may lower your blood pressure below normal. Sometimes taking too much medicine or taking medicine not as directed can cause hypotension. TREATMENT  Hospitalization is sometimes required  for hypotension if fluid or blood replacement is needed, if time is needed for medicines to wear off, or if further monitoring is needed. Treatment might include changing your diet, changing your medicines  (including medicines aimed at raising your blood pressure), and use of support stockings. HOME CARE INSTRUCTIONS   Drink enough fluids to keep your urine clear or pale yellow.  Take your medicines as directed by your health care provider.  Get up slowly from reclining or sitting positions. This gives your blood pressure a chance to adjust.  Wear support stockings as directed by your health care provider.  Maintain a healthy diet by including nutritious food, such as fruits, vegetables, nuts, whole grains, and lean meats. SEEK MEDICAL CARE IF:  You have vomiting or diarrhea.  You have a fever for more than 2-3 days.  You feel more thirsty than usual.  You feel weak and tired. SEEK IMMEDIATE MEDICAL CARE IF:   You have chest pain or a fast or irregular heartbeat.  You have a loss of feeling in some part of your body, or you lose movement in your arms or legs.  You have trouble speaking.  You become sweaty or feel lightheaded.  You faint. MAKE SURE YOU:   Understand these instructions.  Will watch your condition.  Will get help right away if you are not doing well or get worse.   This information is not intended to replace advice given to you by your health care provider. Make sure you discuss any questions you have with your health care provider.   Document Released: 09/19/2005 Document Revised: 07/10/2013 Document Reviewed: 03/22/2013 Elsevier Interactive Patient Education 2016 Elsevier Inc. Coronary Calcium Scan A coronary calcium scan is an imaging test used to look for deposits of calcium and other fatty materials (plaques) in the inner lining of the blood vessels of your heart (coronary arteries). These deposits of calcium and plaques can partly clog and narrow the coronary arteries without producing any symptoms or warning signs. This puts you at risk for a heart attack. This test can detect these deposits before symptoms develop.  LET [facility] CARE PROVIDER KNOW  ABOUT:  Any allergies you have.  All medicines you are taking, including vitamins, herbs, eye drops, creams, and over-the-counter medicines.  Previous problems you or members of your family have had with the use of anesthetics.  Any blood disorders you have.  Previous surgeries you have had.  Medical conditions you have.  Possibility of pregnancy, if this applies. RISKS AND COMPLICATIONS Generally, this is a safe procedure. However, as with any procedure, complications can occur. This test involves the use of radiation. Radiation exposure can be dangerous to a pregnant woman and her unborn baby. If you are pregnant, you should not have this procedure done.  BEFORE THE PROCEDURE There is no special preparation for the procedure. PROCEDURE  You will need to undress and put on a hospital gown. You will need to remove any jewelry around your neck or chest.  Sticky electrodes are placed on your chest and are connected to an electrocardiogram (EKG or electrocardiography) machine to recorda tracing of the electrical activity of your heart.  A CT scanner will take pictures of your heart. During this time, you will be asked to lie still and hold your breath for 2-3 seconds while a picture is being taken of your heart. AFTER THE PROCEDURE   You will be allowed to get dressed.  You can return to your normal activities after  the scan is done.   This information is not intended to replace advice given to you by your health care provider. Make sure you discuss any questions you have with your health care provider.   Document Released: 03/17/2008 Document Revised: 09/24/2013 Document Reviewed: 05/27/2013 Elsevier Interactive Patient Education [employer].

## 2015-11-17 ENCOUNTER — Ambulatory Visit
Admission: RE | Admit: 2015-11-17 | Discharge: 2015-11-17 | Disposition: A | Discharge status: Home / Self Care | Payer: Medicare Other | Source: Ambulatory Visit | Attending: Obstetrics and Gynecology | Admitting: Obstetrics and Gynecology

## 2015-11-17 ENCOUNTER — Other Ambulatory Visit: Payer: Self-pay | Admitting: Obstetrics and Gynecology

## 2015-11-17 DIAGNOSIS — Z1231 Encounter for screening mammogram for malignant neoplasm of breast: Secondary | ICD-10-CM | POA: Diagnosis not present

## 2015-11-17 HISTORY — DX: Malignant neoplasm of unspecified site of unspecified female breast: C50.919

## 2016-05-15 DIAGNOSIS — Z23 Encounter for immunization: Secondary | ICD-10-CM | POA: Diagnosis not present

## 2016-10-10 NOTE — Progress Notes (Signed)
Patient ID: Terri Clark, female   DOB: [birth year], 76 y.o.   MRN: [id number] ANNUAL PREVENTATIVE CARE GYN  ENCOUNTER NOTE  Subjective:       Terri Clark is a 76 y.o. G14P2002 female here for a routine annual gynecologic exam.  Current complaints: 1.refill omeprazole  Patient is feeling much better this year. She does walk on the treadmill 4 times a week for about 35-45 minutes. Bowel function is normal. Bladder function is normal. She is taking her calcium with vitamin D. No major interval health issues are identified. Blood pressure readings at home range anywhere from 119-140/58-69. 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 Son recently had 95% blockage in coronary, requiring 3 stents   Terri Clark presents today for her medicare breast and pelvic.   Gynecologic History No LMP recorded. Patient is postmenopausal. Contraception: post menopausal status Last Pap: 09/16/2014 neg results were: normal/ Last mammogram: 11/13/2015 birad 1. Results were: wnl  Obstetric History OB History  Gravida Para Term Preterm AB Living  2 2 2     2   SAB TAB Ectopic Multiple Live Births          2    # Outcome Date GA Lbr Len/2nd Weight Sex Delivery Anes PTL Lv  2 Term      Vag-Spont   LIV  1 Term      Vag-Spont   LIV      Past Medical History:  Diagnosis Date  . Basal cell carcinoma   . Breast cancer ([place]) 2009   left  . Colon polyps   . 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 Duodenal ulcer   . Gastritis   . Hyperlipemia   . Internal hemorrhoid   . Menopause   . Uterine prolapse   . Vaginal atrophy     Past Surgical History:  Procedure Laterality Date  . DILATION AND CURETTAGE OF UTERUS    . MASTECTOMY     BILATERAL SIMPLE WITH SENTINTEL NODE BIOPSY  . MASTECTOMY Left 2009    Current Outpatient Prescriptions on File Prior to Visit  Medication Sig Dispense Refill  . aspirin 81 MG tablet  Take 81 mg by mouth daily.    Marland Kitchen b complex vitamins tablet Take 1 tablet by mouth daily.    . calcium-vitamin D (OSCAL WITH D) 500-200 MG-UNIT tablet Take 1 tablet by mouth.    . Chromium Picolinate 500 MCG CAPS Take by mouth.    . loratadine (CLARITIN) 10 MG tablet Take 10 mg by mouth daily.    . Melatonin 10 MG CAPS Take by mouth.    . Multiple Vitamins-Minerals (MULTIVITAMIN ADULT PO) Take by mouth.    . omega-3 acid ethyl esters (LOVAZA) 1 G capsule Take by mouth 2 (two) times daily.    Marland Kitchen omeprazole (PRILOSEC) 40 MG capsule Take 1 capsule (40 mg total) by mouth daily. 90 capsule 3   No current facility-administered medications on file prior to visit.     Allergies  Allergen Reactions  . Amoxicillin   . Biaxin [Clarithromycin]   . Theracodophen-325 [Hydrocodone-Apap-Dietary Prod]     Social History   Social History  . Marital status: Married    Spouse name: N/A  . Number of children: N/A  . Years of education: N/A   Occupational History  . Not on file.   Social History Main Topics  . Smoking status: Never Smoker  .  Smokeless tobacco: Not on file  . Alcohol use No  . Drug use: No  . Sexual activity: Not Currently    Birth control/ protection: Post-menopausal   Other Topics Concern  . Not on file   Social History Narrative  . No narrative on file    Family History  Problem Relation Age of Onset  . Colon cancer Sister   . Breast cancer Neg Hx   . Ovarian cancer Neg Hx   . Diabetes Neg Hx   . Heart disease Neg Hx     The following portions of the patient's history were reviewed and updated as appropriate: allergies, current medications, past family history, past medical history, past social history, past surgical history and problem list.  Review of Systems ROS Review of Systems - General ROS: negative for - chills, fatigue, fever, hot flashes, night sweats, weight gain or weight loss Psychological ROS: negative for - anxiety, decreased libido, depression,  mood swings, physical abuse or sexual abuse Ophthalmic ROS: negative for - blurry vision, eye pain or loss of vision ENT ROS: negative for - headaches, hearing change, visual changes or vocal changes Allergy and Immunology ROS: negative for - hives, itchy/watery eyes or seasonal allergies Hematological and Lymphatic ROS: negative for - bleeding problems, bruising, swollen lymph nodes or weight loss Endocrine ROS: negative for - galactorrhea, hair pattern changes, hot flashes, malaise/lethargy, mood swings, palpitations, polydipsia/polyuria, skin changes, temperature intolerance or unexpected weight changes Breast ROS: negative for - new or changing breast lumps or nipple discharge Respiratory ROS: negative for - cough or shortness of breath Cardiovascular ROS: negative for - chest pain, irregular heartbeat, palpitations or shortness of breath Gastrointestinal ROS: no abdominal pain, change in bowel habits, or black or bloody stools Genito-Urinary ROS: no dysuria, trouble voiding, or hematuria Musculoskeletal ROS: negative for - joint pain or joint stiffness Neurological ROS: negative for - bowel and bladder control changes Dermatological ROS: negative for rash and skin lesion changes   Objective:    BP (!) 165/67   Pulse 80   Ht 5\' 2"  (1.575 m)   Wt 134 lb 1.6 oz (60.8 kg)   BMI 24.53 kg/m   CONSTITUTIONAL: Well-developed, well-nourished female in no acute distress.  PSYCHIATRIC: Normal mood and affect. Normal behavior. Normal judgment and thought content. [place]: Alert and oriented to person, place, and time. Normal muscle tone coordination. No cranial nerve deficit noted. HENT:  Normocephalic, atraumatic, External right and left ear normal. Oropharynx is clear and moist EYES: Conjunctivae and EOM are normal. Pupils are equal, round, and reactive to light. No scleral icterus.  NECK: Normal range of motion, supple, no masses.  Normal thyroid.  SKIN: Skin is warm and dry. No rash  noted. Not diaphoretic. No erythema. No pallor. CARDIOVASCULAR: Normal heart rate noted, regular rhythm, no murmur.  RESPIRATORY: Clear to auscultation bilaterally. Effort and breath sounds normal, no problems with respiration noted. BREASTS: No masses, skin changes, nipple drainage, or lymphadenopathy. Post mastectomy changes, nontender, left breast ABDOMEN: Soft, normal bowel sounds, no distention noted.  No tenderness, rebound or guarding.  BLADDER: Normal PELVIC:  External Genitalia: Normal  BUS: Urethral caruncle present  Vagina:  Atrophic changes present, moderate  Cervix: Normal; second-degree prolapse with cervix coming within 3 cm of the introitus   Uterus: Normal size and shape, prolapsed to mid vagina (second-degree)   Adnexa: Normal  RV: External Exam NormaI, No Rectal Masses and Normal Sphincter tone  MUSCULOSKELETAL: Normal range of motion. No tenderness.  No cyanosis,  clubbing, or edema.  2+ distal pulses. LYMPHATIC: No Axillary, Supraclavicular, or Inguinal Adenopathy.    Assessment:   Annual gynecologic examination 76 y.o. Contraception: post menopausal status bmi- 24 Vaginal atrophy, asymptomatic   Plan:  Pap: Not needed Mammogram: Ordered Stool Guaiac Testing:  Ordered Labs: Lipid 1 and Hemoglobin A1C Routine preventative health maintenance measures emphasized: Exercise/Diet/Weight control, Tobacco Warnings, Alcohol/Substance use risks and Stress Management  Return to [place], CMA  Brayton Mars, MD   Note: This dictation was prepared with Dragon dictation along with smaller phrase technology. Any transcriptional errors that result from this process are unintentional.

## 2016-10-11 ENCOUNTER — Ambulatory Visit (INDEPENDENT_AMBULATORY_CARE_PROVIDER_SITE_OTHER): Payer: Medicare Other | Admitting: Obstetrics and Gynecology

## 2016-10-11 ENCOUNTER — Encounter: Payer: Self-pay | Admitting: Obstetrics and Gynecology

## 2016-10-11 ENCOUNTER — Other Ambulatory Visit: Payer: Self-pay | Admitting: Obstetrics and Gynecology

## 2016-10-11 VITALS — BP 165/67 | HR 80 | Ht 62.0 in | Wt 134.1 lb

## 2016-10-11 DIAGNOSIS — Z1231 Encounter for screening mammogram for malignant neoplasm of breast: Secondary | ICD-10-CM | POA: Diagnosis not present

## 2016-10-11 DIAGNOSIS — N952 Postmenopausal atrophic vaginitis: Secondary | ICD-10-CM | POA: Diagnosis not present

## 2016-10-11 DIAGNOSIS — R7309 Other abnormal glucose: Secondary | ICD-10-CM | POA: Diagnosis not present

## 2016-10-11 DIAGNOSIS — N814 Uterovaginal prolapse, unspecified: Secondary | ICD-10-CM

## 2016-10-11 DIAGNOSIS — E7800 Pure hypercholesterolemia, unspecified: Secondary | ICD-10-CM

## 2016-10-11 DIAGNOSIS — Z1239 Encounter for other screening for malignant neoplasm of breast: Secondary | ICD-10-CM

## 2016-10-11 DIAGNOSIS — Z780 Asymptomatic menopausal state: Secondary | ICD-10-CM | POA: Diagnosis not present

## 2016-10-11 DIAGNOSIS — Z1211 Encounter for screening for malignant neoplasm of colon: Secondary | ICD-10-CM

## 2016-10-11 MED ORDER — OMEPRAZOLE 40 MG PO CPDR: 40.0000 mg | DELAYED_RELEASE_CAPSULE | Freq: Every day | ORAL | 3 refills | Status: DC

## 2016-10-11 NOTE — Patient Instructions (Signed)
1. No Pap smear is needed 2. Mammogram is ordered 3. Stool guaiac cards are given for colon cancer screening 4. Continue with calcium and vitamin D supplementation 5. Continue with healthy eating and exercise 6. Omeprazole is refilled for 1 year 7. Return in 1 year   Health Maintenance for Postmenopausal Women Introduction Menopause is a normal process in which your reproductive ability comes to an end. This process happens gradually over a span of months to years, usually between the ages of 47 and 29. Menopause is complete when you have missed 12 consecutive menstrual periods. It is important to talk with your health care provider about some of the most common conditions that affect postmenopausal women, such as heart disease, cancer, and bone loss (osteoporosis). Adopting a healthy lifestyle and getting preventive care can help to promote your health and wellness. Those actions can also lower your chances of developing some of these common conditions. What should I know about menopause? During menopause, you may experience a number of symptoms, such as:  Moderate-to-severe hot flashes.  Night sweats.  Decrease in sex drive.  Mood swings.  Headaches.  Tiredness.  Irritability.  Memory problems.  Insomnia. Choosing to treat or not to treat menopausal changes is an individual decision that you make with your health care provider. What should I know about hormone replacement therapy and supplements? Hormone therapy products are effective for treating symptoms that are associated with menopause, such as hot flashes and night sweats. Hormone replacement carries certain risks, especially as you become older. If you are thinking about using estrogen or estrogen with progestin treatments, discuss the benefits and risks with your health care provider. What should I know about heart disease and stroke? Heart disease, heart attack, and stroke become more likely as you age. This may be  due, in part, to the hormonal changes that your body experiences during menopause. These can affect how your body processes dietary fats, triglycerides, and cholesterol. Heart attack and stroke are both medical emergencies. There are many things that you can do to help prevent heart disease and stroke:  Have your blood pressure checked at least every 1-2 years. High blood pressure causes heart disease and increases the risk of stroke.  If you are 26-15 years old, ask your health care provider if you should take aspirin to prevent a heart attack or a stroke.  Do not use any tobacco products, including cigarettes, chewing tobacco, or electronic cigarettes. If you need help quitting, ask your health care provider.  It is important to eat a healthy diet and maintain a healthy weight.  Be sure to include plenty of vegetables, fruits, low-fat dairy products, and lean protein.  Avoid eating foods that are high in solid fats, added sugars, or salt (sodium).  Get regular exercise. This is one of the most important things that you can do for your health.  Try to exercise for at least 150 minutes each week. The type of exercise that you do should increase your heart rate and make you sweat. This is known as moderate-intensity exercise.  Try to do strengthening exercises at least twice each week. Do these in addition to the moderate-intensity exercise.  Know your numbers.Ask your health care provider to check your cholesterol and your blood glucose. Continue to have your blood tested as directed by your health care provider. What should I know about cancer screening? There are several types of cancer. Take the following steps to reduce your risk and to catch any cancer  development as early as possible. Breast Cancer  Practice breast self-awareness.  This means understanding how your breasts normally appear and feel.  It also means doing regular breast self-exams. Let your health care provider know  about any changes, no matter how small.  If you are 26 or older, have a clinician do a breast exam (clinical breast exam or CBE) every year. Depending on your age, family history, and medical history, it may be recommended that you also have a yearly breast X-ray (mammogram).  If you have a family history of breast cancer, talk with your health care provider about genetic screening.  If you are at high risk for breast cancer, talk with your health care provider about having an MRI and a mammogram every year.  Breast cancer (BRCA) gene test is recommended for women who have family members with BRCA-related cancers. Results of the assessment will determine the need for genetic counseling and BRCA1 and for BRCA2 testing. BRCA-related cancers include these types:  Breast. This occurs in males or females.  Ovarian.  Tubal. This may also be called fallopian tube cancer.  Cancer of the abdominal or pelvic lining (peritoneal cancer).  Prostate.  Pancreatic. Cervical, Uterine, and Ovarian Cancer  Your health care provider may recommend that you be screened regularly for cancer of the pelvic organs. These include your ovaries, uterus, and vagina. This screening involves a pelvic exam, which includes checking for microscopic changes to the surface of your cervix (Pap test).  For women ages 21-65, health care providers may recommend a pelvic exam and a Pap test every three years. For women ages 16-65, they may recommend the Pap test and pelvic exam, combined with testing for human papilloma virus (HPV), every five years. Some types of HPV increase your risk of cervical cancer. Testing for HPV may also be done on women of any age who have unclear Pap test results.  Other health care providers may not recommend any screening for nonpregnant women who are considered low risk for pelvic cancer and have no symptoms. Ask your health care provider if a screening pelvic exam is right for you.  If you have  had past treatment for cervical cancer or a condition that could lead to cancer, you need Pap tests and screening for cancer for at least 20 years after your treatment. If Pap tests have been discontinued for you, your risk factors (such as having a new sexual partner) need to be reassessed to determine if you should start having screenings again. Some women have medical problems that increase the chance of getting cervical cancer. In these cases, your health care provider may recommend that you have screening and Pap tests more often.  If you have a family history of uterine cancer or ovarian cancer, talk with your health care provider about genetic screening.  If you have vaginal bleeding after reaching menopause, tell your health care provider.  There are currently no reliable tests available to screen for ovarian cancer. Lung Cancer  Lung cancer screening is recommended for adults 80-42 years old who are at high risk for lung cancer because of a history of smoking. A yearly low-dose CT scan of the lungs is recommended if you:  Currently smoke.  Have a history of at least 30 pack-years of smoking and you currently smoke or have quit within the past 15 years. A pack-year is smoking an average of one pack of cigarettes per day for one year. Yearly screening should:  Continue until it has  been 15 years since you quit.  Stop if you develop a health problem that would prevent you from having lung cancer treatment. Colorectal Cancer  This type of cancer can be detected and can often be prevented.  Routine colorectal cancer screening usually begins at age 18 and continues through age 14.  If you have risk factors for colon cancer, your health care provider may recommend that you be screened at an earlier age.  If you have a family history of colorectal cancer, talk with your health care provider about genetic screening.  Your health care provider may also recommend using home test kits to  check for hidden blood in your stool.  A small camera at the end of a tube can be used to examine your colon directly (sigmoidoscopy or colonoscopy). This is done to check for the earliest forms of colorectal cancer.  Direct examination of the colon should be repeated every 5-10 years until age 63. However, if early forms of precancerous polyps or small growths are found or if you have a family history or genetic risk for colorectal cancer, you may need to be screened more often. Skin Cancer  Check your skin from head to toe regularly.  Monitor any moles. Be sure to tell your health care provider:  About any new moles or changes in moles, especially if there is a change in a mole's shape or color.  If you have a mole that is larger than the size of a pencil eraser.  If any of your family members has a history of skin cancer, especially at a young age, talk with your health care provider about genetic screening.  Always use sunscreen. Apply sunscreen liberally and repeatedly throughout the day.  Whenever you are outside, protect yourself by wearing long sleeves, pants, a wide-brimmed hat, and sunglasses. What should I know about osteoporosis? Osteoporosis is a condition in which bone destruction happens more quickly than new bone creation. After menopause, you may be at an increased risk for osteoporosis. To help prevent osteoporosis or the bone fractures that can happen because of osteoporosis, the following is recommended:  If you are 63-81 years old, get at least 1,000 mg of calcium and at least 600 mg of vitamin D per day.  If you are older than age 56 but younger than age 37, get at least 1,200 mg of calcium and at least 600 mg of vitamin D per day.  If you are older than age 30, get at least 1,200 mg of calcium and at least 800 mg of vitamin D per day. Smoking and excessive alcohol intake increase the risk of osteoporosis. Eat foods that are rich in calcium and vitamin D, and do  weight-bearing exercises several times each week as directed by your health care provider. What should I know about how menopause affects my mental health? Depression may occur at any age, but it is more common as you become older. Common symptoms of depression include:  Low or sad mood.  Changes in sleep patterns.  Changes in appetite or eating patterns.  Feeling an overall lack of motivation or enjoyment of activities that you previously enjoyed.  Frequent crying spells. Talk with your health care provider if you think that you are experiencing depression. What should I know about immunizations? It is important that you get and maintain your immunizations. These include:  Tetanus, diphtheria, and pertussis (Tdap) booster vaccine.  Influenza every year before the flu season begins.  Pneumonia vaccine.  Shingles vaccine.  Your health care provider may also recommend other immunizations. This information is not intended to replace advice given to you by your health care provider. Make sure you discuss any questions you have with your health care provider. Document Released: 11/11/2005 Document Revised: 04/08/2016 Document Reviewed: 06/23/2015  2017 [employer]

## 2016-10-12 LAB — LIPID PANEL
CHOL/HDL RATIO: 2.7 ratio (ref 0.0–4.4)
Cholesterol, Total: 247 mg/dL — ABNORMAL HIGH (ref 100–199)
HDL: 90 mg/dL (ref >39)
LDL Calculated: 137 mg/dL — ABNORMAL HIGH (ref 0–99)
Triglycerides: 98 mg/dL (ref 0–149)
VLDL CHOLESTEROL CAL: 20 mg/dL (ref 5–40)

## 2016-10-12 LAB — HEMOGLOBIN A1C
Est. average glucose Bld gHb Est-mCnc: 105 mg/dL
Hgb A1c MFr Bld: 5.3 % (ref 4.8–5.6)

## 2016-10-12 LAB — FECAL OCCULT BLOOD, IMMUNOCHEMICAL: FECAL OCCULT BLD: NEGATIVE

## 2016-10-12 LAB — GLUCOSE, RANDOM: GLUCOSE: 90 mg/dL (ref 65–99)

## 2016-10-13 ENCOUNTER — Telehealth: Payer: Self-pay | Admitting: Obstetrics and Gynecology

## 2016-10-13 NOTE — Telephone Encounter (Signed)
Pt aware of lab work.

## 2016-10-13 NOTE — Telephone Encounter (Signed)
Patient returned your call. You can reach her on her home phone [phone]. Thanks

## 2016-10-13 NOTE — Telephone Encounter (Signed)
error 

## 2016-10-14 ENCOUNTER — Telehealth: Payer: Self-pay

## 2016-10-14 DIAGNOSIS — E785 Hyperlipidemia, unspecified: Secondary | ICD-10-CM

## 2016-10-14 NOTE — Telephone Encounter (Signed)
Pt has elevated lipid x 2 years. She states her twin sister does also. Sister is on simvastatin 10 mg qd. She was wondering if she could be rxed this med? Pt aware message will be sent to mad.

## 2016-10-21 ENCOUNTER — Telehealth: Payer: Self-pay | Admitting: Obstetrics and Gynecology

## 2016-10-21 NOTE — Telephone Encounter (Signed)
PT CALLED AND IS REQ YOU TO CALL HER BACK.

## 2016-10-21 NOTE — Telephone Encounter (Signed)
Pt aware mad has her message (would like to try simvastatin). She is also aware he has been out of the office for a family emergency. Pt aware I will contact asap. Pt very understanding.

## 2016-10-26 MED ORDER — SIMVASTATIN 10 MG PO TABS: 10.0000 mg | ORAL_TABLET | Freq: Every day | ORAL | 0 refills | Status: DC

## 2016-10-26 NOTE — Telephone Encounter (Signed)
Pt aware. Med erx. Lab ordered. Lab appt made for 4/24.

## 2016-11-23 ENCOUNTER — Ambulatory Visit
Admission: RE | Admit: 2016-11-23 | Discharge: 2016-11-23 | Disposition: A | Discharge status: Home / Self Care | Payer: Medicare Other | Source: Ambulatory Visit | Attending: Obstetrics and Gynecology | Admitting: Obstetrics and Gynecology

## 2016-11-23 DIAGNOSIS — Z1231 Encounter for screening mammogram for malignant neoplasm of breast: Secondary | ICD-10-CM | POA: Insufficient documentation

## 2016-11-23 DIAGNOSIS — Z9012 Acquired absence of left breast and nipple: Secondary | ICD-10-CM | POA: Diagnosis not present

## 2017-01-24 ENCOUNTER — Other Ambulatory Visit: Payer: Medicare Other

## 2017-01-24 DIAGNOSIS — E785 Hyperlipidemia, unspecified: Secondary | ICD-10-CM | POA: Diagnosis not present

## 2017-01-25 ENCOUNTER — Telehealth: Payer: Self-pay

## 2017-01-25 LAB — LIPID PANEL
CHOL/HDL RATIO: 2.5 ratio (ref 0.0–4.4)
Cholesterol, Total: 218 mg/dL — ABNORMAL HIGH (ref 100–199)
HDL: 88 mg/dL (ref >39)
LDL CALC: 112 mg/dL — AB (ref 0–99)
Triglycerides: 92 mg/dL (ref 0–149)
VLDL Cholesterol Cal: 18 mg/dL (ref 5–40)

## 2017-01-25 MED ORDER — SIMVASTATIN 10 MG PO TABS: 10.0000 mg | ORAL_TABLET | Freq: Every day | ORAL | 1 refills | Status: DC

## 2017-01-25 NOTE — Telephone Encounter (Signed)
Pt aware of lab results. Pt would like to wait until 10/2017(annual visit) to have her labs repeated. Mad ok with that plan.

## 2017-01-25 NOTE — Telephone Encounter (Signed)
-----   Message from Brayton Mars, MD sent at 01/25/2017  8:20 AM EDT ----- Please notify - Abnormal Labs Lipid panel is improving. Total cholesterol is decreased to slightly above normal range. HDL cholesterol (good cholesterol) is an excellent suggesting outstanding exercise LDL cholesterol (bad cholesterol) is decreasing and slightly above normal range Recommend repeating labwork in 6 months

## 2017-07-12 ENCOUNTER — Telehealth: Payer: Self-pay | Admitting: Obstetrics and Gynecology

## 2017-07-12 MED ORDER — SIMVASTATIN 10 MG PO TABS: 10.0000 mg | ORAL_TABLET | Freq: Every day | ORAL | 1 refills | Status: DC

## 2017-07-12 NOTE — Telephone Encounter (Signed)
Patient LVM that she needs to speak with CM about her script   Please call

## 2017-07-12 NOTE — Telephone Encounter (Signed)
Pt needs refill of simvastatin. RF given.

## 2017-10-17 ENCOUNTER — Encounter: Payer: Self-pay | Admitting: Obstetrics and Gynecology

## 2017-10-17 ENCOUNTER — Ambulatory Visit (INDEPENDENT_AMBULATORY_CARE_PROVIDER_SITE_OTHER): Payer: Medicare Other | Admitting: Obstetrics and Gynecology

## 2017-10-17 VITALS — BP 178/79 | HR 101 | Ht 62.0 in | Wt 145.0 lb

## 2017-10-17 DIAGNOSIS — D0512 Intraductal carcinoma in situ of left breast: Secondary | ICD-10-CM | POA: Diagnosis not present

## 2017-10-17 DIAGNOSIS — N952 Postmenopausal atrophic vaginitis: Secondary | ICD-10-CM | POA: Diagnosis not present

## 2017-10-17 DIAGNOSIS — Z1211 Encounter for screening for malignant neoplasm of colon: Secondary | ICD-10-CM | POA: Diagnosis not present

## 2017-10-17 DIAGNOSIS — Z124 Encounter for screening for malignant neoplasm of cervix: Secondary | ICD-10-CM

## 2017-10-17 DIAGNOSIS — N814 Uterovaginal prolapse, unspecified: Secondary | ICD-10-CM

## 2017-10-17 DIAGNOSIS — Z1239 Encounter for other screening for malignant neoplasm of breast: Secondary | ICD-10-CM

## 2017-10-17 DIAGNOSIS — Z853 Personal history of malignant neoplasm of breast: Secondary | ICD-10-CM | POA: Diagnosis not present

## 2017-10-17 DIAGNOSIS — Z1231 Encounter for screening mammogram for malignant neoplasm of breast: Secondary | ICD-10-CM

## 2017-10-17 DIAGNOSIS — E785 Hyperlipidemia, unspecified: Secondary | ICD-10-CM | POA: Diagnosis not present

## 2017-10-17 NOTE — Patient Instructions (Signed)
1.  No Pap smear is done. 2.  Mammogram is ordered 3.  Stool guaiac cards are given for colon cancer screening 4.  Screening labs today include lipid 1 panel and hemoglobin A1c 5.  Continue with healthy eating and exercise 6.  Continue with calcium and vitamin D supplementation 7.  Return in 1 year for annual exam   Health Maintenance for Postmenopausal Women Menopause is a normal process in which your reproductive ability comes to an end. This process happens gradually over a span of months to years, usually between the ages of 68 and 110. Menopause is complete when you have missed 12 consecutive menstrual periods. It is important to talk with your health care provider about some of the most common conditions that affect postmenopausal women, such as heart disease, cancer, and bone loss (osteoporosis). Adopting a healthy lifestyle and getting preventive care can help to promote your health and wellness. Those actions can also lower your chances of developing some of these common conditions. What should I know about menopause? During menopause, you may experience a number of symptoms, such as:  Moderate-to-severe hot flashes.  Night sweats.  Decrease in sex drive.  Mood swings.  Headaches.  Tiredness.  Irritability.  Memory problems.  Insomnia.  Choosing to treat or not to treat menopausal changes is an individual decision that you make with your health care provider. What should I know about hormone replacement therapy and supplements? Hormone therapy products are effective for treating symptoms that are associated with menopause, such as hot flashes and night sweats. Hormone replacement carries certain risks, especially as you become older. If you are thinking about using estrogen or estrogen with progestin treatments, discuss the benefits and risks with your health care provider. What should I know about heart disease and stroke? Heart disease, heart attack, and stroke become  more likely as you age. This may be due, in part, to the hormonal changes that your body experiences during menopause. These can affect how your body processes dietary fats, triglycerides, and cholesterol. Heart attack and stroke are both medical emergencies. There are many things that you can do to help prevent heart disease and stroke:  Have your blood pressure checked at least every 1-2 years. High blood pressure causes heart disease and increases the risk of stroke.  If you are 27-69 years old, ask your health care provider if you should take aspirin to prevent a heart attack or a stroke.  Do not use any tobacco products, including cigarettes, chewing tobacco, or electronic cigarettes. If you need help quitting, ask your health care provider.  It is important to eat a healthy diet and maintain a healthy weight. ? Be sure to include plenty of vegetables, fruits, low-fat dairy products, and lean protein. ? Avoid eating foods that are high in solid fats, added sugars, or salt (sodium).  Get regular exercise. This is one of the most important things that you can do for your health. ? Try to exercise for at least 150 minutes each week. The type of exercise that you do should increase your heart rate and make you sweat. This is known as moderate-intensity exercise. ? Try to do strengthening exercises at least twice each week. Do these in addition to the moderate-intensity exercise.  Know your numbers.Ask your health care provider to check your cholesterol and your blood glucose. Continue to have your blood tested as directed by your health care provider.  What should I know about cancer screening? There are several types  of cancer. Take the following steps to reduce your risk and to catch any cancer development as early as possible. Breast Cancer  Practice breast self-awareness. ? This means understanding how your breasts normally appear and feel. ? It also means doing regular breast  self-exams. Let your health care provider know about any changes, no matter how small.  If you are 65 or older, have a clinician do a breast exam (clinical breast exam or CBE) every year. Depending on your age, family history, and medical history, it may be recommended that you also have a yearly breast X-ray (mammogram).  If you have a family history of breast cancer, talk with your health care provider about genetic screening.  If you are at high risk for breast cancer, talk with your health care provider about having an MRI and a mammogram every year.  Breast cancer (BRCA) gene test is recommended for women who have family members with BRCA-related cancers. Results of the assessment will determine the need for genetic counseling and BRCA1 and for BRCA2 testing. BRCA-related cancers include these types: ? Breast. This occurs in males or females. ? Ovarian. ? Tubal. This may also be called fallopian tube cancer. ? Cancer of the abdominal or pelvic lining (peritoneal cancer). ? Prostate. ? Pancreatic.  Cervical, Uterine, and Ovarian Cancer Your health care provider may recommend that you be screened regularly for cancer of the pelvic organs. These include your ovaries, uterus, and vagina. This screening involves a pelvic exam, which includes checking for microscopic changes to the surface of your cervix (Pap test).  For women ages 21-65, health care providers may recommend a pelvic exam and a Pap test every three years. For women ages 63-65, they may recommend the Pap test and pelvic exam, combined with testing for human papilloma virus (HPV), every five years. Some types of HPV increase your risk of cervical cancer. Testing for HPV may also be done on women of any age who have unclear Pap test results.  Other health care providers may not recommend any screening for nonpregnant women who are considered low risk for pelvic cancer and have no symptoms. Ask your health care provider if a screening  pelvic exam is right for you.  If you have had past treatment for cervical cancer or a condition that could lead to cancer, you need Pap tests and screening for cancer for at least 20 years after your treatment. If Pap tests have been discontinued for you, your risk factors (such as having a new sexual partner) need to be reassessed to determine if you should start having screenings again. Some women have medical problems that increase the chance of getting cervical cancer. In these cases, your health care provider may recommend that you have screening and Pap tests more often.  If you have a family history of uterine cancer or ovarian cancer, talk with your health care provider about genetic screening.  If you have vaginal bleeding after reaching menopause, tell your health care provider.  There are currently no reliable tests available to screen for ovarian cancer.  Lung Cancer Lung cancer screening is recommended for adults 37-32 years old who are at high risk for lung cancer because of a history of smoking. A yearly low-dose CT scan of the lungs is recommended if you:  Currently smoke.  Have a history of at least 30 pack-years of smoking and you currently smoke or have quit within the past 15 years. A pack-year is smoking an average of one pack  of cigarettes per day for one year.  Yearly screening should:  Continue until it has been 15 years since you quit.  Stop if you develop a health problem that would prevent you from having lung cancer treatment.  Colorectal Cancer  This type of cancer can be detected and can often be prevented.  Routine colorectal cancer screening usually begins at age 32 and continues through age 95.  If you have risk factors for colon cancer, your health care provider may recommend that you be screened at an earlier age.  If you have a family history of colorectal cancer, talk with your health care provider about genetic screening.  Your health care  provider may also recommend using home test kits to check for hidden blood in your stool.  A small camera at the end of a tube can be used to examine your colon directly (sigmoidoscopy or colonoscopy). This is done to check for the earliest forms of colorectal cancer.  Direct examination of the colon should be repeated every 5-10 years until age 61. However, if early forms of precancerous polyps or small growths are found or if you have a family history or genetic risk for colorectal cancer, you may need to be screened more often.  Skin Cancer  Check your skin from head to toe regularly.  Monitor any moles. Be sure to tell your health care provider: ? About any new moles or changes in moles, especially if there is a change in a mole's shape or color. ? If you have a mole that is larger than the size of a pencil eraser.  If any of your family members has a history of skin cancer, especially at a young age, talk with your health care provider about genetic screening.  Always use sunscreen. Apply sunscreen liberally and repeatedly throughout the day.  Whenever you are outside, protect yourself by wearing long sleeves, pants, a wide-brimmed hat, and sunglasses.  What should I know about osteoporosis? Osteoporosis is a condition in which bone destruction happens more quickly than new bone creation. After menopause, you may be at an increased risk for osteoporosis. To help prevent osteoporosis or the bone fractures that can happen because of osteoporosis, the following is recommended:  If you are 41-75 years old, get at least 1,000 mg of calcium and at least 600 mg of vitamin D per day.  If you are older than age 70 but younger than age 76, get at least 1,200 mg of calcium and at least 600 mg of vitamin D per day.  If you are older than age 49, get at least 1,200 mg of calcium and at least 800 mg of vitamin D per day.  Smoking and excessive alcohol intake increase the risk of osteoporosis. Eat  foods that are rich in calcium and vitamin D, and do weight-bearing exercises several times each week as directed by your health care provider. What should I know about how menopause affects my mental health? Depression may occur at any age, but it is more common as you become older. Common symptoms of depression include:  Low or sad mood.  Changes in sleep patterns.  Changes in appetite or eating patterns.  Feeling an overall lack of motivation or enjoyment of activities that you previously enjoyed.  Frequent crying spells.  Talk with your health care provider if you think that you are experiencing depression. What should I know about immunizations? It is important that you get and maintain your immunizations. These include:  Tetanus,  diphtheria, and pertussis (Tdap) booster vaccine.  Influenza every year before the flu season begins.  Pneumonia vaccine.  Shingles vaccine.  Your health care provider may also recommend other immunizations. This information is not intended to replace advice given to you by your health care provider. Make sure you discuss any questions you have with your health care provider. Document Released: 11/11/2005 Document Revised: 04/08/2016 Document Reviewed: 06/23/2015 [employer] Interactive Patient Education  2018 [employer].

## 2017-10-17 NOTE — Progress Notes (Signed)
Patient ID: Terri Clark, female   DOB: 01/29/1941, 77 y.o.   MRN: [id number] ANNUAL PREVENTATIVE CARE GYN  ENCOUNTER NOTE  Subjective:       Terri Clark is a 77 y.o. G30P2002 female here for a routine annual gynecologic exam.  Current complaints: 1.none  Patient is feeling much better this year. She does walk on the treadmill 4 times a week for about 35-45 minutes. Bowel function is normal. Bladder function is normal. She is taking her calcium with vitamin D. No major interval health issues are identified. 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 Son  had 95% blockage in coronary, requiring 3 stents over 1 year ago; currently doing well  [name] presents today for her medicare breast and pelvic.   Gynecologic History No LMP recorded. Patient is postmenopausal. Contraception: post menopausal status Last Pap: 09/16/2014 neg results were: normal/ Last mammogram: 11/12/2016 birad 1. Results were: wnl  Obstetric History OB History  Gravida Para Term Preterm AB Living  2 2 2     2   SAB TAB Ectopic Multiple Live Births          2    # Outcome Date GA Lbr Len/2nd Weight Sex Delivery Anes PTL Lv  2 Term      Vag-Spont   LIV  1 Term      Vag-Spont   LIV      Past Medical History:  Diagnosis Date  . Basal cell carcinoma   . Breast cancer ([place]) 2009   LT MASTECTOMY  . Colon polyps   . 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 Duodenal ulcer   . Gastritis   . Hyperlipemia   . Internal hemorrhoid   . Menopause   . Uterine prolapse   . Vaginal atrophy     Past Surgical History:  Procedure Laterality Date  . DILATION AND CURETTAGE OF UTERUS    . MASTECTOMY     BILATERAL SIMPLE WITH SENTINTEL NODE BIOPSY  . MASTECTOMY Left  2009   BREAST CA    Current Outpatient Medications on File Prior to Visit  Medication Sig Dispense Refill  . aspirin 81 MG tablet Take 81 mg by mouth daily.    Marland Kitchen b complex vitamins tablet Take 1 tablet by mouth daily.    . calcium-vitamin D (OSCAL WITH D) 500-200 MG-UNIT tablet Take 1 tablet by mouth.    . Chromium Picolinate 500 MCG CAPS Take by mouth.    . loratadine (CLARITIN) 10 MG tablet Take 10 mg by mouth daily.    . Melatonin 10 MG CAPS Take by mouth.    . Multiple Vitamins-Minerals (MULTIVITAMIN ADULT PO) Take by mouth.    . omega-3 acid ethyl esters (LOVAZA) 1 G capsule Take by mouth 2 (two) times daily.    Marland Kitchen omeprazole (PRILOSEC) 40 MG capsule Take 1 capsule (40 mg total) by mouth daily. 90 capsule 3  . simvastatin (ZOCOR) 10 MG tablet Take 1 tablet (10 mg total) by mouth daily. 90 tablet 1   No current facility-administered medications on file prior to visit.     Allergies  Allergen Reactions  . Amoxicillin   . Biaxin [Clarithromycin]   . Hydrocodone     Social History   Socioeconomic History  . Marital  status: Married    Spouse name: Not on file  . Number of children: Not on file  . Years of education: Not on file  . Highest education level: Not on file  Social Needs  . Financial resource strain: Not on file  . Food insecurity - worry: Not on file  . Food insecurity - inability: Not on file  . Transportation needs - medical: Not on file  . Transportation needs - non-medical: Not on file  Occupational History  . Not on file  Tobacco Use  . Smoking status: Never Smoker  . Smokeless tobacco: Never Used  Substance and Sexual Activity  . Alcohol use: No  . Drug use: No  . Sexual activity: Not Currently    Birth control/protection: Post-menopausal  Other Topics Concern  . Not on file  Social History Narrative  . Not on file    Family History  Problem Relation Age of Onset  . Colon cancer Sister   . Breast cancer Neg Hx   . Ovarian cancer Neg Hx   .  Diabetes Neg Hx   . Heart disease Neg Hx     The following portions of the patient's history were reviewed and updated as appropriate: allergies, current medications, past family history, past medical history, past social history, past surgical history and problem list.  Review of Systems Review of Systems  Constitutional: Negative.   HENT: Negative.   Respiratory: Negative.   Cardiovascular: Negative.   Gastrointestinal: Negative.   Genitourinary:       Nocturia x3  Musculoskeletal: Negative.   Skin: Negative.   Neurological: Negative.   Psychiatric/Behavioral: Negative.     Objective:    BP (!) 178/79   Pulse (!) 101   Ht 5\' 2"  (1.575 m)   Wt 145 lb (65.8 kg)   BMI 26.52 kg/m   CONSTITUTIONAL: Well-developed, well-nourished female in no acute distress.  PSYCHIATRIC: Normal mood and affect. Normal behavior. Normal judgment and thought content. [place]: Alert and oriented to person, place, and time. Normal muscle tone coordination. No cranial nerve deficit noted. HENT:  Normocephalic, atraumatic, External right and left ear normal. Oropharynx is clear and moist EYES: Conjunctivae and EOM are normal. Pupils are equal, round, and reactive to light. No scleral icterus.  NECK: Normal range of motion, supple, no masses.  Normal thyroid.  SKIN: Skin is warm and dry. No rash noted. Not diaphoretic. No erythema. No pallor. CARDIOVASCULAR: Normal heart rate noted, regular rhythm, no murmur.  RESPIRATORY: Clear to auscultation bilaterally. Effort and breath sounds normal, no problems with respiration noted. BREASTS: No masses, skin changes, nipple drainage, or lymphadenopathy. Post mastectomy changes, nontender, left breast ABDOMEN: Soft, normal bowel sounds, no distention noted.  No tenderness, rebound or guarding.  BLADDER: Normal PELVIC:  External Genitalia: Normal  BUS: Urethral caruncle present  Vagina:  Atrophic changes present, moderate  Cervix: Normal; second-degree  prolapse with cervix coming within 3 cm of the introitus   Uterus: Normal size and shape, prolapsed to mid vagina (second-degree)   Adnexa: Normal  RV: External Exam NormaI, No Rectal Masses and Normal Sphincter tone  MUSCULOSKELETAL: Normal range of motion. No tenderness.  No cyanosis, clubbing, or edema.  2+ distal pulses. LYMPHATIC: No Axillary, Supraclavicular, or Inguinal Adenopathy.    Assessment:   Annual gynecologic examination 77 y.o. Contraception: post menopausal status bmi- 24 Vaginal atrophy, asymptomatic Uterine prolapse-stable, asymptomatic History of DCIS, status post left mastectomy and status post tamoxifen therapy;NED Elevated blood pressure in office; review of  blood pressures at home normal   Plan:  Pap: Not needed Mammogram: Ordered Stool Guaiac Testing:  Ordered Labs: Lipid 1 and Hemoglobin A1C Routine preventative health maintenance measures emphasized: Exercise/Diet/Weight control, Tobacco Warnings, Alcohol/Substance use risks and Stress Management  Return to Clinic - [place], CMA  Brayton Mars, MD  Note: This dictation was prepared with Dragon dictation along with smaller phrase technology. Any transcriptional errors that result from this process are unintentional.

## 2017-10-18 LAB — GLUCOSE, RANDOM: Glucose: 90 mg/dL (ref 65–99)

## 2017-10-18 LAB — HEMOGLOBIN A1C
Est. average glucose Bld gHb Est-mCnc: 111 mg/dL
Hgb A1c MFr Bld: 5.5 % (ref 4.8–5.6)

## 2017-10-18 NOTE — Addendum Note (Signed)
Addended by: Elouise Munroe on: 10/18/2017 03:02 PM   Modules accepted: Orders

## 2017-10-19 ENCOUNTER — Other Ambulatory Visit: Payer: Medicare Other

## 2017-10-19 DIAGNOSIS — E785 Hyperlipidemia, unspecified: Secondary | ICD-10-CM | POA: Diagnosis not present

## 2017-10-19 LAB — FECAL OCCULT BLOOD, IMMUNOCHEMICAL: Fecal Occult Bld: NEGATIVE

## 2017-10-20 LAB — LIPID PANEL
CHOL/HDL RATIO: 2.4 ratio (ref 0.0–4.4)
Cholesterol, Total: 198 mg/dL (ref 100–199)
HDL: 82 mg/dL (ref >39)
LDL CALC: 94 mg/dL (ref 0–99)
TRIGLYCERIDES: 111 mg/dL (ref 0–149)
VLDL CHOLESTEROL CAL: 22 mg/dL (ref 5–40)

## 2017-10-24 ENCOUNTER — Telehealth: Payer: Self-pay

## 2017-10-24 MED ORDER — OMEPRAZOLE 40 MG PO CPDR: 40.0000 mg | DELAYED_RELEASE_CAPSULE | Freq: Every day | ORAL | 3 refills | Status: DC

## 2017-10-24 MED ORDER — SIMVASTATIN 10 MG PO TABS: 10.0000 mg | ORAL_TABLET | Freq: Every day | ORAL | 3 refills | Status: DC

## 2017-10-24 NOTE — Telephone Encounter (Signed)
Pt aware of lab results. Meds erx.

## 2017-10-24 NOTE — Telephone Encounter (Signed)
-----   Message from Brayton Mars, MD sent at 10/24/2017  8:15 AM EST ----- Please Notify - Labs normal Lipid panel is excellent.  Cholesterol HDL ratio suggests risk for heart disease is half normal.

## 2017-11-28 ENCOUNTER — Ambulatory Visit
Admission: RE | Admit: 2017-11-28 | Discharge: 2017-11-28 | Disposition: A | Discharge status: Home / Self Care | Payer: Medicare Other | Source: Ambulatory Visit | Attending: Obstetrics and Gynecology | Admitting: Obstetrics and Gynecology

## 2017-11-28 DIAGNOSIS — Z1231 Encounter for screening mammogram for malignant neoplasm of breast: Secondary | ICD-10-CM | POA: Insufficient documentation

## 2017-11-28 DIAGNOSIS — Z1239 Encounter for other screening for malignant neoplasm of breast: Secondary | ICD-10-CM

## 2018-09-14 ENCOUNTER — Telehealth: Payer: Self-pay | Admitting: Obstetrics and Gynecology

## 2018-09-14 MED ORDER — OMEPRAZOLE 40 MG PO CPDR: 40.0000 mg | DELAYED_RELEASE_CAPSULE | Freq: Every day | ORAL | 1 refills | Status: AC

## 2018-09-14 MED ORDER — SIMVASTATIN 10 MG PO TABS: 10.0000 mg | ORAL_TABLET | Freq: Every day | ORAL | 1 refills | Status: AC

## 2018-09-14 NOTE — Telephone Encounter (Signed)
Spoke with patient and she has decided to go with Dr. Loney Hering at [facility] as her PCP. She said that it could take a little time to get in with him. I have sent in a new prescription for 90 day supply and 1 refill of her Zocor and Prilosec to pharmacy.

## 2018-09-14 NOTE — Telephone Encounter (Signed)
Patient called with questions regarding her refills that will be due after Dr D is gone. She has chosen to not see one of the other docs. She will be transferring her care elsewhere. Thanks

## 2018-10-23 ENCOUNTER — Encounter: Payer: Medicare Other | Admitting: Obstetrics and Gynecology

## 2018-11-08 DIAGNOSIS — Z853 Personal history of malignant neoplasm of breast: Secondary | ICD-10-CM | POA: Diagnosis not present

## 2018-11-08 DIAGNOSIS — Z79899 Other long term (current) drug therapy: Secondary | ICD-10-CM | POA: Diagnosis not present

## 2018-11-08 DIAGNOSIS — E7800 Pure hypercholesterolemia, unspecified: Secondary | ICD-10-CM | POA: Diagnosis not present

## 2018-11-08 DIAGNOSIS — Z0000 Encounter for general adult medical examination without abnormal findings: Secondary | ICD-10-CM | POA: Diagnosis not present

## 2018-11-09 ENCOUNTER — Other Ambulatory Visit: Payer: Self-pay | Admitting: Family Medicine

## 2018-11-09 DIAGNOSIS — Z1231 Encounter for screening mammogram for malignant neoplasm of breast: Secondary | ICD-10-CM

## 2018-12-11 ENCOUNTER — Ambulatory Visit
Admission: RE | Admit: 2018-12-11 | Discharge: 2018-12-11 | Disposition: A | Discharge status: Home / Self Care | Payer: Medicare Other | Source: Ambulatory Visit | Attending: Family Medicine | Admitting: Family Medicine

## 2018-12-11 DIAGNOSIS — Z1231 Encounter for screening mammogram for malignant neoplasm of breast: Secondary | ICD-10-CM | POA: Insufficient documentation

## 2019-11-15 ENCOUNTER — Other Ambulatory Visit: Payer: Self-pay | Admitting: Family Medicine

## 2019-11-15 DIAGNOSIS — Z1231 Encounter for screening mammogram for malignant neoplasm of breast: Secondary | ICD-10-CM

## 2020-01-07 ENCOUNTER — Ambulatory Visit
Admission: RE | Admit: 2020-01-07 | Discharge: 2020-01-07 | Disposition: A | Discharge status: Home / Self Care | Payer: Medicare Other | Source: Ambulatory Visit | Attending: Family Medicine | Admitting: Family Medicine

## 2020-01-07 DIAGNOSIS — Z1231 Encounter for screening mammogram for malignant neoplasm of breast: Secondary | ICD-10-CM | POA: Insufficient documentation

## 2020-11-27 ENCOUNTER — Other Ambulatory Visit: Payer: Self-pay | Admitting: Family Medicine

## 2020-11-27 DIAGNOSIS — Z1231 Encounter for screening mammogram for malignant neoplasm of breast: Secondary | ICD-10-CM

## 2021-01-12 ENCOUNTER — Ambulatory Visit
Admission: RE | Admit: 2021-01-12 | Discharge: 2021-01-12 | Disposition: A | Discharge status: Home / Self Care | Payer: Medicare Other | Source: Ambulatory Visit | Attending: Family Medicine | Admitting: Family Medicine

## 2021-01-12 ENCOUNTER — Other Ambulatory Visit: Payer: Self-pay

## 2021-01-12 DIAGNOSIS — Z1231 Encounter for screening mammogram for malignant neoplasm of breast: Secondary | ICD-10-CM | POA: Diagnosis present

## 2021-12-13 ENCOUNTER — Other Ambulatory Visit: Payer: Self-pay | Admitting: Family Medicine

## 2021-12-13 DIAGNOSIS — Z1231 Encounter for screening mammogram for malignant neoplasm of breast: Secondary | ICD-10-CM

## 2022-01-25 ENCOUNTER — Ambulatory Visit
Admission: RE | Admit: 2022-01-25 | Discharge: 2022-01-25 | Disposition: A | Discharge status: Home / Self Care | Payer: Medicare Other | Source: Ambulatory Visit | Attending: Family Medicine | Admitting: Family Medicine

## 2022-01-25 DIAGNOSIS — Z1231 Encounter for screening mammogram for malignant neoplasm of breast: Secondary | ICD-10-CM | POA: Diagnosis present

## 2023-01-24 ENCOUNTER — Other Ambulatory Visit: Payer: Self-pay | Admitting: Family Medicine

## 2023-01-24 DIAGNOSIS — Z1231 Encounter for screening mammogram for malignant neoplasm of breast: Secondary | ICD-10-CM

## 2023-02-14 ENCOUNTER — Ambulatory Visit
Admission: RE | Admit: 2023-02-14 | Discharge: 2023-02-14 | Disposition: A | Discharge status: Home / Self Care | Payer: Medicare Other | Source: Ambulatory Visit | Attending: Family Medicine | Admitting: Family Medicine

## 2023-02-14 DIAGNOSIS — Z1231 Encounter for screening mammogram for malignant neoplasm of breast: Secondary | ICD-10-CM

## 2023-07-20 IMAGING — MG MM DIGITAL SCREENING UNILAT*R* W/ TOMO W/ CAD
6 series · 6 of 18 positions shown · non-contrast
Comparison: Previous exam(s).

CLINICAL DATA: Screening.

EXAM:
DIGITAL SCREENING UNILATERAL RIGHT MAMMOGRAM WITH CAD AND
TOMOSYNTHESIS
TECHNIQUE: Right screening digital craniocaudal and mediolateral oblique
mammograms were obtained. Right screening digital breast
tomosynthesis was performed. The images were evaluated with
computer-aided detection.

[R MLO synth-2D (1 of 2)]
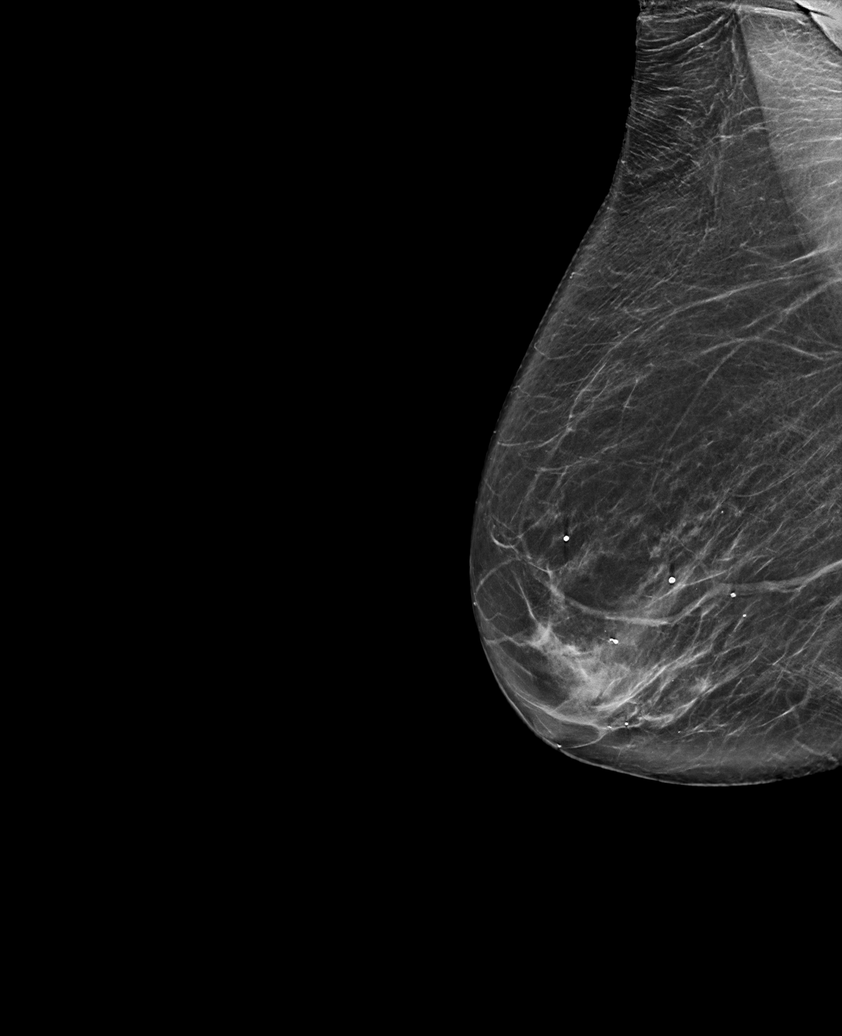

[R MLO synth-2D (2 of 2)]
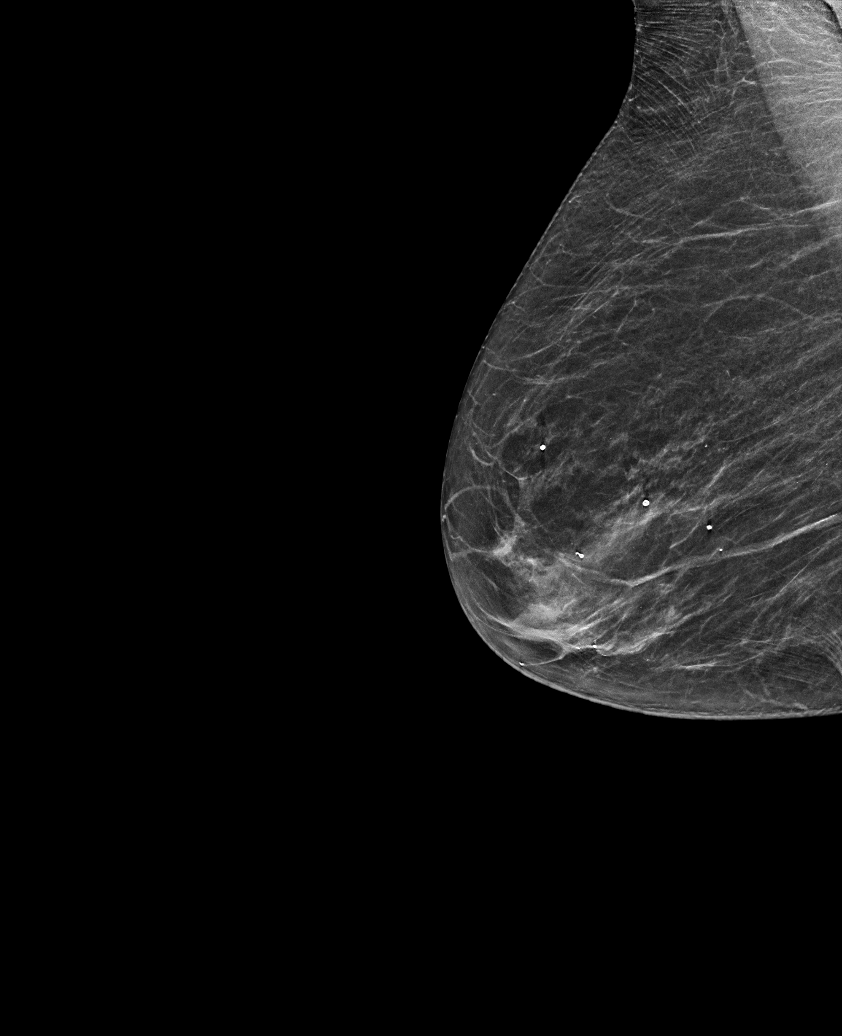

[R CC synth-2D]
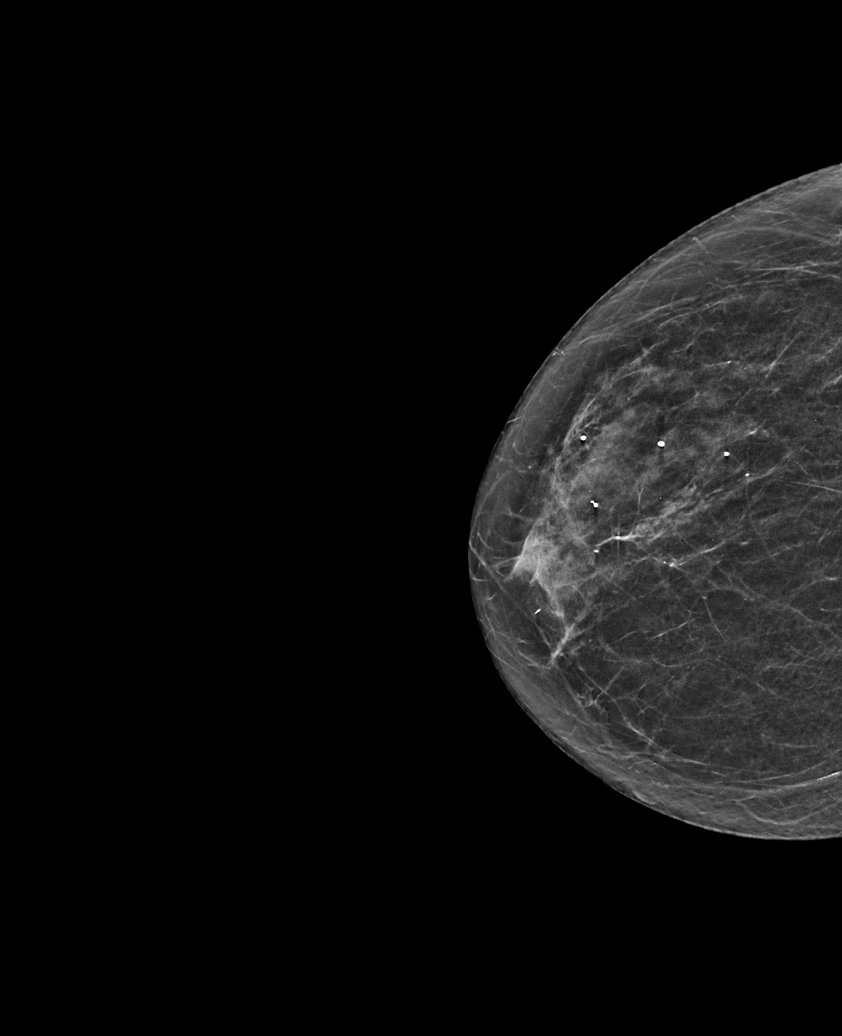

[R MLO tomo (1 of 2) · tomo slice 37/73.0]
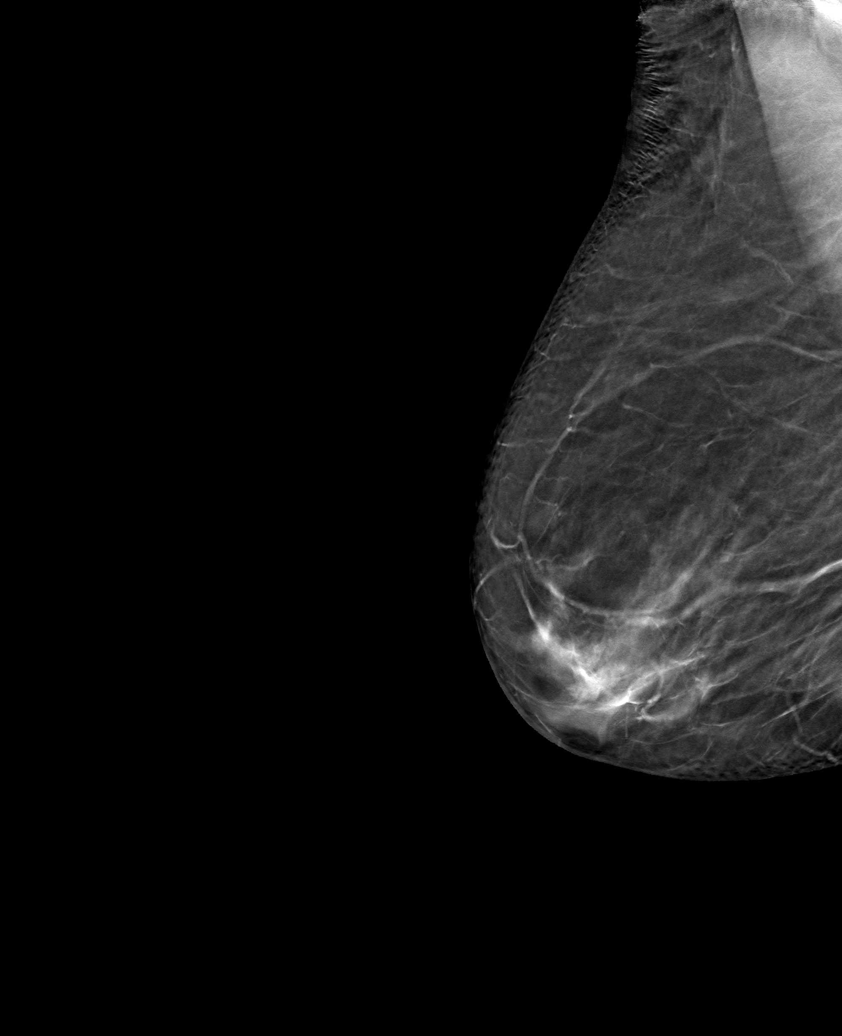

[R CC tomo · tomo slice 27/54.0]
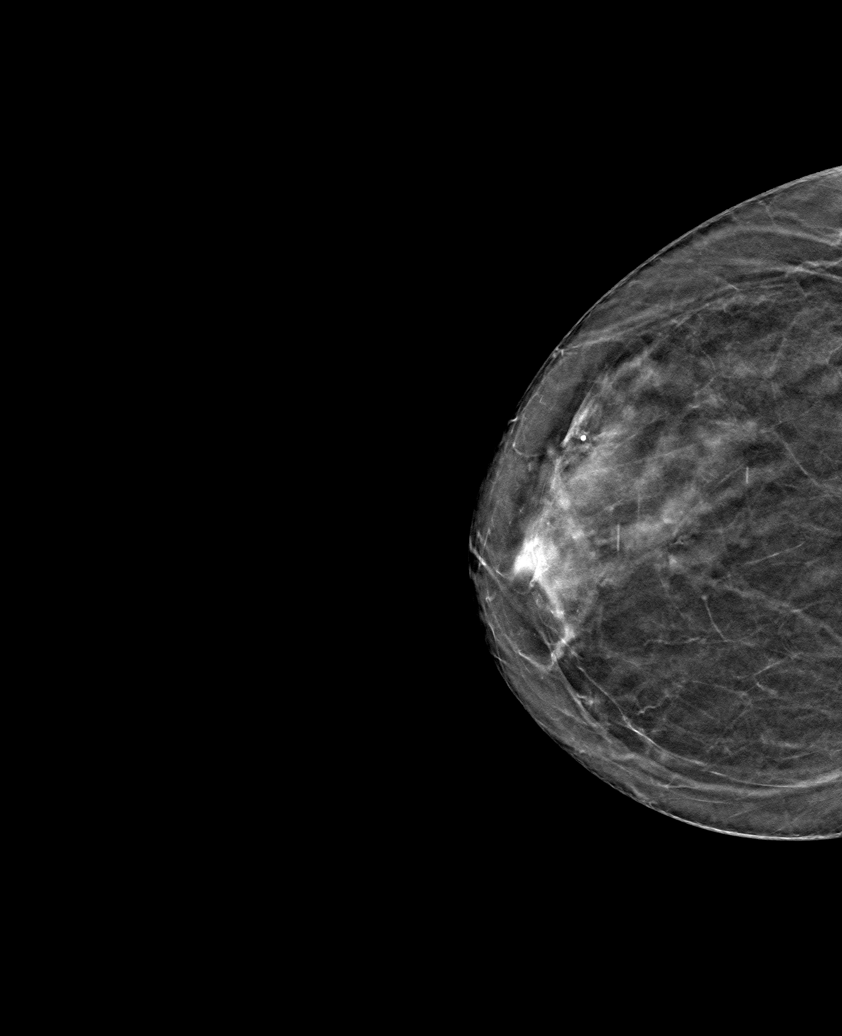

[R MLO tomo (2 of 2) · tomo slice 29/58.0]
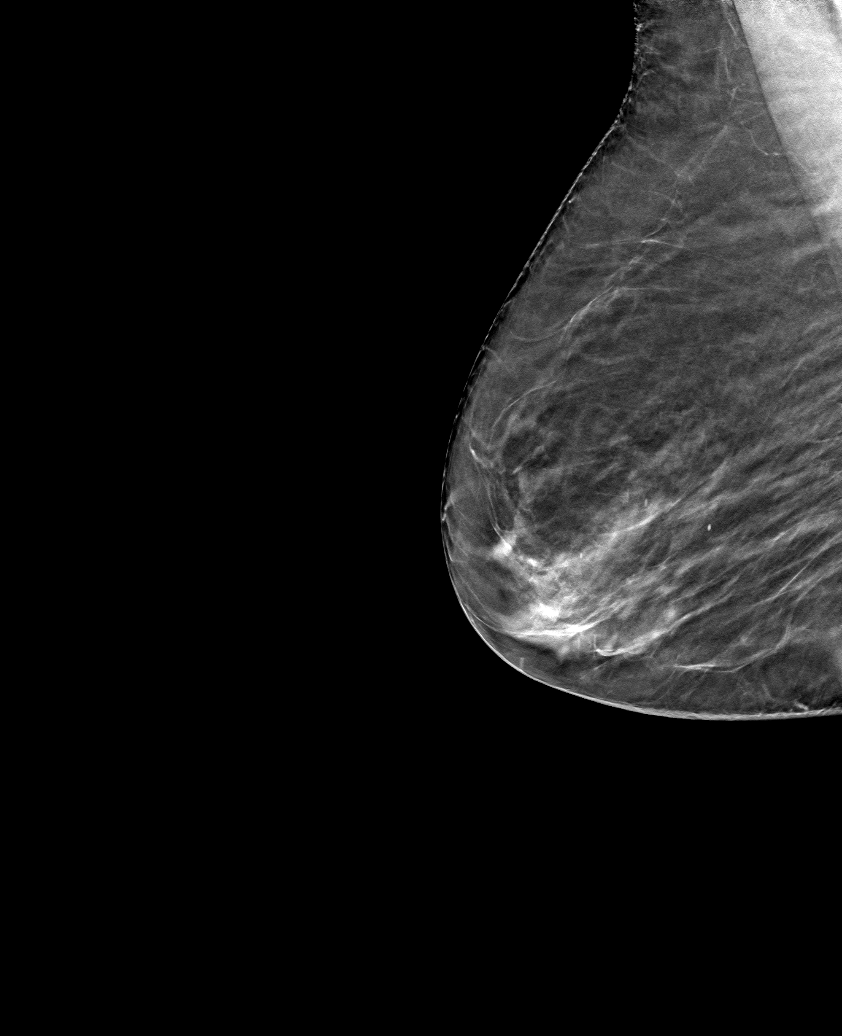

[6 of 18 positions shown; findings below may reference images not displayed]

ACR Breast Density Category b: There are scattered areas of
fibroglandular density.
FINDINGS: The patient has had a left mastectomy. There are no findings
suspicious for malignancy.
IMPRESSION: No mammographic evidence of malignancy. A result letter of this
screening mammogram will be mailed directly to the patient.

RECOMMENDATION:
Screening mammogram in one year.  (Code:NT-E-EGT)

BI-RADS CATEGORY  1: Negative.

## 2024-04-01 ENCOUNTER — Other Ambulatory Visit: Payer: Self-pay | Admitting: Family Medicine

## 2024-04-01 DIAGNOSIS — Z1231 Encounter for screening mammogram for malignant neoplasm of breast: Secondary | ICD-10-CM

## 2024-04-17 ENCOUNTER — Ambulatory Visit
Admission: RE | Admit: 2024-04-17 | Discharge: 2024-04-17 | Disposition: A | Discharge status: Home / Self Care | Source: Ambulatory Visit | Attending: Family Medicine | Admitting: Family Medicine

## 2024-04-17 DIAGNOSIS — Z1231 Encounter for screening mammogram for malignant neoplasm of breast: Secondary | ICD-10-CM | POA: Diagnosis present
